# Patient Record
Sex: Female | Born: 1965 | Race: White | Hispanic: No | Marital: Married | Smoking: Former smoker
Health system: Southern US, Community
[De-identification: ages and names within clinical notes are randomized; demographics above are authoritative.]

## PROBLEM LIST (undated history)

## (undated) DIAGNOSIS — G8929 Other chronic pain: Secondary | ICD-10-CM

## (undated) DIAGNOSIS — M159 Polyosteoarthritis, unspecified: Secondary | ICD-10-CM

## (undated) DIAGNOSIS — J45909 Unspecified asthma, uncomplicated: Secondary | ICD-10-CM

## (undated) DIAGNOSIS — R7611 Nonspecific reaction to tuberculin skin test without active tuberculosis: Secondary | ICD-10-CM

## (undated) DIAGNOSIS — M1990 Unspecified osteoarthritis, unspecified site: Secondary | ICD-10-CM

## (undated) DIAGNOSIS — F411 Generalized anxiety disorder: Secondary | ICD-10-CM

## (undated) DIAGNOSIS — R5383 Other fatigue: Secondary | ICD-10-CM

## (undated) DIAGNOSIS — G4700 Insomnia, unspecified: Secondary | ICD-10-CM

## (undated) DIAGNOSIS — R0609 Other forms of dyspnea: Secondary | ICD-10-CM

## (undated) DIAGNOSIS — N3941 Urge incontinence: Secondary | ICD-10-CM

## (undated) DIAGNOSIS — R42 Dizziness and giddiness: Secondary | ICD-10-CM

## (undated) DIAGNOSIS — R0600 Dyspnea, unspecified: Secondary | ICD-10-CM

## (undated) DIAGNOSIS — F17201 Nicotine dependence, unspecified, in remission: Secondary | ICD-10-CM

## (undated) DIAGNOSIS — K589 Irritable bowel syndrome without diarrhea: Secondary | ICD-10-CM

## (undated) DIAGNOSIS — G43909 Migraine, unspecified, not intractable, without status migrainosus: Secondary | ICD-10-CM

## (undated) DIAGNOSIS — F419 Anxiety disorder, unspecified: Secondary | ICD-10-CM

## (undated) DIAGNOSIS — R609 Edema, unspecified: Secondary | ICD-10-CM

## (undated) DIAGNOSIS — M545 Low back pain, unspecified: Secondary | ICD-10-CM

## (undated) DIAGNOSIS — G44209 Tension-type headache, unspecified, not intractable: Secondary | ICD-10-CM

## (undated) DIAGNOSIS — E782 Mixed hyperlipidemia: Secondary | ICD-10-CM

## (undated) DIAGNOSIS — M549 Dorsalgia, unspecified: Secondary | ICD-10-CM

## (undated) DIAGNOSIS — J329 Chronic sinusitis, unspecified: Secondary | ICD-10-CM

## (undated) DIAGNOSIS — F329 Major depressive disorder, single episode, unspecified: Secondary | ICD-10-CM

## (undated) DIAGNOSIS — M542 Cervicalgia: Secondary | ICD-10-CM

## (undated) DIAGNOSIS — M797 Fibromyalgia: Secondary | ICD-10-CM

## (undated) DIAGNOSIS — M722 Plantar fascial fibromatosis: Secondary | ICD-10-CM

## (undated) DIAGNOSIS — E663 Overweight: Secondary | ICD-10-CM

## (undated) DIAGNOSIS — Z86010 Personal history of colonic polyps: Secondary | ICD-10-CM

## (undated) DIAGNOSIS — F909 Attention-deficit hyperactivity disorder, unspecified type: Secondary | ICD-10-CM

## (undated) DIAGNOSIS — M25511 Pain in right shoulder: Secondary | ICD-10-CM

## (undated) DIAGNOSIS — C44310 Basal cell carcinoma of skin of unspecified parts of face: Secondary | ICD-10-CM

## (undated) HISTORY — DX: Dyspnea, unspecified: R06.00

## (undated) HISTORY — DX: Edema, unspecified: R60.9

## (undated) HISTORY — DX: Personal history of colonic polyps: Z86.010

## (undated) HISTORY — DX: Nonspecific reaction to tuberculin skin test without active tuberculosis: R76.11

## (undated) HISTORY — DX: Major depressive disorder, single episode, unspecified: F32.9

## (undated) HISTORY — DX: Dorsalgia, unspecified: M54.9

## (undated) HISTORY — DX: Plantar fascial fibromatosis: M72.2

## (undated) HISTORY — DX: Overweight: E66.3

## (undated) HISTORY — DX: Generalized anxiety disorder: F41.1

## (undated) HISTORY — DX: Migraine, unspecified, not intractable, without status migrainosus: G43.909

## (undated) HISTORY — DX: Insomnia, unspecified: G47.00

## (undated) HISTORY — DX: Dizziness and giddiness: R42

## (undated) HISTORY — DX: Low back pain, unspecified: M54.50

## (undated) HISTORY — DX: Basal cell carcinoma of skin of unspecified parts of face: C44.310

## (undated) HISTORY — DX: Fibromyalgia: M79.7

## (undated) HISTORY — DX: Chronic sinusitis, unspecified: J32.9

## (undated) HISTORY — DX: Other chronic pain: G89.29

## (undated) HISTORY — DX: Unspecified asthma, uncomplicated: J45.909

## (undated) HISTORY — DX: Unspecified osteoarthritis, unspecified site: M19.90

## (undated) HISTORY — DX: Other fatigue: R53.83

## (undated) HISTORY — DX: Attention-deficit hyperactivity disorder, unspecified type: F90.9

## (undated) HISTORY — DX: Other forms of dyspnea: R06.09

## (undated) HISTORY — DX: Urge incontinence: N39.41

## (undated) HISTORY — DX: Irritable bowel syndrome without diarrhea: K58.9

## (undated) HISTORY — PX: NECK SURGERY: SHX720

## (undated) HISTORY — DX: Nicotine dependence, unspecified, in remission: F17.201

## (undated) HISTORY — DX: Mixed hyperlipidemia: E78.2

## (undated) HISTORY — DX: Cervicalgia: M54.2

## (undated) HISTORY — DX: Tension-type headache, unspecified, not intractable: G44.209

## (undated) HISTORY — PX: DILATION AND CURETTAGE OF UTERUS: SHX78

## (undated) HISTORY — DX: Polyosteoarthritis, unspecified: M15.9

## (undated) HISTORY — PX: OTHER SURGICAL HISTORY: SHX169

## (undated) HISTORY — PX: NOSE SURGERY: SHX723

## (undated) HISTORY — PX: COLONOSCOPY: SHX174

## (undated) HISTORY — DX: Pain in right shoulder: M25.511

---
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
## 1985-06-14 HISTORY — PX: OTHER SURGICAL HISTORY: SHX169

## 1997-07-27 ENCOUNTER — Observation Stay (HOSPITAL_COMMUNITY): Admission: AD | Admit: 1997-07-27 | Discharge: 1997-07-28 | Payer: Self-pay | Admitting: Obstetrics and Gynecology

## 1997-07-31 ENCOUNTER — Inpatient Hospital Stay (HOSPITAL_COMMUNITY): Admission: AD | Admit: 1997-07-31 | Discharge: 1997-08-02 | Payer: Self-pay | Admitting: Obstetrics and Gynecology

## 1999-04-30 ENCOUNTER — Ambulatory Visit (HOSPITAL_COMMUNITY): Admission: RE | Admit: 1999-04-30 | Discharge: 1999-04-30 | Payer: Self-pay | Admitting: Family Medicine

## 1999-04-30 ENCOUNTER — Encounter: Payer: Self-pay | Admitting: Family Medicine

## 1999-09-17 ENCOUNTER — Other Ambulatory Visit: Admission: RE | Admit: 1999-09-17 | Discharge: 1999-09-17 | Payer: Self-pay | Admitting: Obstetrics and Gynecology

## 1999-10-03 ENCOUNTER — Ambulatory Visit (HOSPITAL_COMMUNITY): Admission: RE | Admit: 1999-10-03 | Discharge: 1999-10-03 | Payer: Self-pay | Admitting: Obstetrics and Gynecology

## 2000-06-14 HISTORY — PX: OTHER SURGICAL HISTORY: SHX169

## 2000-09-30 ENCOUNTER — Encounter: Payer: Self-pay | Admitting: Emergency Medicine

## 2000-09-30 ENCOUNTER — Emergency Department (HOSPITAL_COMMUNITY): Admission: AC | Admit: 2000-09-30 | Discharge: 2000-09-30 | Payer: Self-pay

## 2000-10-12 ENCOUNTER — Encounter: Admission: RE | Admit: 2000-10-12 | Discharge: 2000-10-12 | Payer: Self-pay | Admitting: *Deleted

## 2000-11-11 ENCOUNTER — Other Ambulatory Visit: Admission: RE | Admit: 2000-11-11 | Discharge: 2000-11-11 | Payer: Self-pay | Admitting: Obstetrics and Gynecology

## 2000-12-24 ENCOUNTER — Encounter: Admission: RE | Admit: 2000-12-24 | Discharge: 2000-12-24 | Payer: Self-pay | Admitting: *Deleted

## 2001-01-19 ENCOUNTER — Encounter: Admission: RE | Admit: 2001-01-19 | Discharge: 2001-03-30 | Payer: Self-pay | Admitting: Family Medicine

## 2001-04-20 ENCOUNTER — Ambulatory Visit (HOSPITAL_BASED_OUTPATIENT_CLINIC_OR_DEPARTMENT_OTHER): Admission: RE | Admit: 2001-04-20 | Discharge: 2001-04-20 | Payer: Self-pay | Admitting: Orthopedic Surgery

## 2001-04-28 ENCOUNTER — Encounter: Admission: RE | Admit: 2001-04-28 | Discharge: 2001-07-27 | Payer: Self-pay | Admitting: Orthopedic Surgery

## 2001-07-28 ENCOUNTER — Encounter: Admission: RE | Admit: 2001-07-28 | Discharge: 2001-10-26 | Payer: Self-pay | Admitting: Orthopedic Surgery

## 2001-08-30 ENCOUNTER — Encounter: Payer: Self-pay | Admitting: Neurosurgery

## 2001-08-30 ENCOUNTER — Inpatient Hospital Stay (HOSPITAL_COMMUNITY): Admission: RE | Admit: 2001-08-30 | Discharge: 2001-08-31 | Payer: Self-pay | Admitting: Neurosurgery

## 2001-11-15 ENCOUNTER — Other Ambulatory Visit: Admission: RE | Admit: 2001-11-15 | Discharge: 2001-11-15 | Payer: Self-pay | Admitting: Obstetrics and Gynecology

## 2001-11-16 ENCOUNTER — Encounter (INDEPENDENT_AMBULATORY_CARE_PROVIDER_SITE_OTHER): Payer: Self-pay | Admitting: *Deleted

## 2001-11-16 ENCOUNTER — Observation Stay (HOSPITAL_COMMUNITY): Admission: RE | Admit: 2001-11-16 | Discharge: 2001-11-17 | Payer: Self-pay | Admitting: Obstetrics and Gynecology

## 2002-01-12 ENCOUNTER — Encounter: Payer: Self-pay | Admitting: Orthopedic Surgery

## 2002-01-12 ENCOUNTER — Encounter: Admission: RE | Admit: 2002-01-12 | Discharge: 2002-01-12 | Payer: Self-pay | Admitting: Orthopedic Surgery

## 2002-06-14 HISTORY — PX: OTHER SURGICAL HISTORY: SHX169

## 2002-06-14 HISTORY — PX: ABDOMINAL HYSTERECTOMY: SHX81

## 2002-07-06 ENCOUNTER — Encounter (INDEPENDENT_AMBULATORY_CARE_PROVIDER_SITE_OTHER): Payer: Self-pay | Admitting: Specialist

## 2002-07-06 ENCOUNTER — Ambulatory Visit (HOSPITAL_BASED_OUTPATIENT_CLINIC_OR_DEPARTMENT_OTHER): Admission: RE | Admit: 2002-07-06 | Discharge: 2002-07-06 | Payer: Self-pay | Admitting: Otolaryngology

## 2002-11-03 ENCOUNTER — Emergency Department (HOSPITAL_COMMUNITY): Admission: EM | Admit: 2002-11-03 | Discharge: 2002-11-03 | Payer: Self-pay | Admitting: Emergency Medicine

## 2002-12-21 ENCOUNTER — Other Ambulatory Visit: Admission: RE | Admit: 2002-12-21 | Discharge: 2002-12-21 | Payer: Self-pay | Admitting: Obstetrics and Gynecology

## 2003-11-07 ENCOUNTER — Inpatient Hospital Stay (HOSPITAL_COMMUNITY): Admission: RE | Admit: 2003-11-07 | Discharge: 2003-11-08 | Payer: Self-pay | Admitting: Neurosurgery

## 2004-07-19 ENCOUNTER — Emergency Department (HOSPITAL_COMMUNITY): Admission: EM | Admit: 2004-07-19 | Discharge: 2004-07-19 | Payer: Self-pay | Admitting: Emergency Medicine

## 2004-11-14 ENCOUNTER — Emergency Department (HOSPITAL_COMMUNITY): Admission: EM | Admit: 2004-11-14 | Discharge: 2004-11-15 | Payer: Self-pay | Admitting: Emergency Medicine

## 2005-05-20 ENCOUNTER — Ambulatory Visit (HOSPITAL_BASED_OUTPATIENT_CLINIC_OR_DEPARTMENT_OTHER): Admission: RE | Admit: 2005-05-20 | Discharge: 2005-05-20 | Payer: Self-pay | Admitting: Family Medicine

## 2005-05-23 ENCOUNTER — Ambulatory Visit: Payer: Self-pay | Admitting: Internal Medicine

## 2006-12-12 ENCOUNTER — Ambulatory Visit (HOSPITAL_COMMUNITY): Admission: RE | Admit: 2006-12-12 | Discharge: 2006-12-12 | Payer: Self-pay | Admitting: Otolaryngology

## 2007-06-15 ENCOUNTER — Emergency Department (HOSPITAL_COMMUNITY): Admission: EM | Admit: 2007-06-15 | Discharge: 2007-06-15 | Payer: Self-pay | Admitting: Emergency Medicine

## 2007-06-15 HISTORY — PX: OTHER SURGICAL HISTORY: SHX169

## 2008-05-28 ENCOUNTER — Ambulatory Visit (HOSPITAL_BASED_OUTPATIENT_CLINIC_OR_DEPARTMENT_OTHER): Admission: RE | Admit: 2008-05-28 | Discharge: 2008-05-28 | Payer: Self-pay | Admitting: Orthopaedic Surgery

## 2008-06-14 HISTORY — PX: OTHER SURGICAL HISTORY: SHX169

## 2009-12-03 ENCOUNTER — Ambulatory Visit: Payer: Self-pay | Admitting: Diagnostic Radiology

## 2009-12-03 ENCOUNTER — Emergency Department (HOSPITAL_BASED_OUTPATIENT_CLINIC_OR_DEPARTMENT_OTHER): Admission: EM | Admit: 2009-12-03 | Discharge: 2009-12-04 | Payer: Self-pay | Admitting: Emergency Medicine

## 2009-12-04 ENCOUNTER — Ambulatory Visit: Payer: Self-pay | Admitting: Radiology

## 2010-04-22 ENCOUNTER — Emergency Department (HOSPITAL_BASED_OUTPATIENT_CLINIC_OR_DEPARTMENT_OTHER): Admission: EM | Admit: 2010-04-22 | Discharge: 2010-04-22 | Payer: Self-pay | Admitting: Emergency Medicine

## 2010-08-25 ENCOUNTER — Other Ambulatory Visit: Payer: Self-pay | Admitting: Orthopedic Surgery

## 2010-08-25 DIAGNOSIS — M25511 Pain in right shoulder: Secondary | ICD-10-CM

## 2010-08-30 ENCOUNTER — Ambulatory Visit
Admission: RE | Admit: 2010-08-30 | Discharge: 2010-08-30 | Disposition: A | Discharge status: Home / Self Care | Payer: BC Managed Care – PPO | Source: Ambulatory Visit | Attending: Orthopedic Surgery | Admitting: Orthopedic Surgery

## 2010-08-30 DIAGNOSIS — M25511 Pain in right shoulder: Secondary | ICD-10-CM

## 2010-08-30 LAB — POCT B-TYPE NATRIURETIC PEPTIDE (BNP): B Natriuretic Peptide, POC: <5 pg/mL (ref 0–100)

## 2010-08-30 LAB — D-DIMER, QUANTITATIVE: D-Dimer, Quant: <0.22 ug/mL-FEU (ref 0.00–0.48)

## 2010-10-20 ENCOUNTER — Encounter: Payer: Self-pay | Admitting: Family Medicine

## 2010-10-20 ENCOUNTER — Ambulatory Visit (INDEPENDENT_AMBULATORY_CARE_PROVIDER_SITE_OTHER): Payer: BC Managed Care – PPO | Admitting: Family Medicine

## 2010-10-20 DIAGNOSIS — C44310 Basal cell carcinoma of skin of unspecified parts of face: Secondary | ICD-10-CM

## 2010-10-20 DIAGNOSIS — G44209 Tension-type headache, unspecified, not intractable: Secondary | ICD-10-CM

## 2010-10-20 DIAGNOSIS — N3941 Urge incontinence: Secondary | ICD-10-CM

## 2010-10-20 DIAGNOSIS — N809 Endometriosis, unspecified: Secondary | ICD-10-CM

## 2010-10-20 DIAGNOSIS — M25519 Pain in unspecified shoulder: Secondary | ICD-10-CM

## 2010-10-20 DIAGNOSIS — R5381 Other malaise: Secondary | ICD-10-CM

## 2010-10-20 DIAGNOSIS — F339 Major depressive disorder, recurrent, unspecified: Secondary | ICD-10-CM

## 2010-10-20 DIAGNOSIS — R7611 Nonspecific reaction to tuberculin skin test without active tuberculosis: Secondary | ICD-10-CM

## 2010-10-20 DIAGNOSIS — R1013 Epigastric pain: Secondary | ICD-10-CM

## 2010-10-20 DIAGNOSIS — J329 Chronic sinusitis, unspecified: Secondary | ICD-10-CM

## 2010-10-20 DIAGNOSIS — J45990 Exercise induced bronchospasm: Secondary | ICD-10-CM | POA: Insufficient documentation

## 2010-10-20 DIAGNOSIS — K589 Irritable bowel syndrome without diarrhea: Secondary | ICD-10-CM

## 2010-10-20 DIAGNOSIS — R609 Edema, unspecified: Secondary | ICD-10-CM

## 2010-10-20 DIAGNOSIS — Z720 Tobacco use: Secondary | ICD-10-CM

## 2010-10-20 DIAGNOSIS — C44319 Basal cell carcinoma of skin of other parts of face: Secondary | ICD-10-CM

## 2010-10-20 DIAGNOSIS — G8929 Other chronic pain: Secondary | ICD-10-CM

## 2010-10-20 DIAGNOSIS — F17200 Nicotine dependence, unspecified, uncomplicated: Secondary | ICD-10-CM

## 2010-10-20 DIAGNOSIS — R5383 Other fatigue: Secondary | ICD-10-CM

## 2010-10-20 DIAGNOSIS — M797 Fibromyalgia: Secondary | ICD-10-CM

## 2010-10-20 DIAGNOSIS — J309 Allergic rhinitis, unspecified: Secondary | ICD-10-CM

## 2010-10-20 DIAGNOSIS — IMO0001 Reserved for inherently not codable concepts without codable children: Secondary | ICD-10-CM

## 2010-10-20 DIAGNOSIS — M542 Cervicalgia: Secondary | ICD-10-CM

## 2010-10-20 DIAGNOSIS — G43909 Migraine, unspecified, not intractable, without status migrainosus: Secondary | ICD-10-CM

## 2010-10-20 DIAGNOSIS — F329 Major depressive disorder, single episode, unspecified: Secondary | ICD-10-CM

## 2010-10-20 DIAGNOSIS — M549 Dorsalgia, unspecified: Secondary | ICD-10-CM

## 2010-10-20 DIAGNOSIS — M722 Plantar fascial fibromatosis: Secondary | ICD-10-CM

## 2010-10-20 DIAGNOSIS — Z0000 Encounter for general adult medical examination without abnormal findings: Secondary | ICD-10-CM

## 2010-10-20 DIAGNOSIS — M25511 Pain in right shoulder: Secondary | ICD-10-CM | POA: Insufficient documentation

## 2010-10-20 DIAGNOSIS — E663 Overweight: Secondary | ICD-10-CM

## 2010-10-20 DIAGNOSIS — E785 Hyperlipidemia, unspecified: Secondary | ICD-10-CM

## 2010-10-20 DIAGNOSIS — F32A Depression, unspecified: Secondary | ICD-10-CM | POA: Insufficient documentation

## 2010-10-20 DIAGNOSIS — J45909 Unspecified asthma, uncomplicated: Secondary | ICD-10-CM

## 2010-10-20 DIAGNOSIS — K3189 Other diseases of stomach and duodenum: Secondary | ICD-10-CM

## 2010-10-20 HISTORY — DX: Other chronic pain: G89.29

## 2010-10-20 HISTORY — DX: Unspecified asthma, uncomplicated: J45.909

## 2010-10-20 HISTORY — DX: Major depressive disorder, recurrent, unspecified: F33.9

## 2010-10-20 MED ORDER — RANITIDINE HCL 300 MG PO TABS: 300.0000 mg | ORAL_TABLET | Freq: Every day | ORAL | Status: DC

## 2010-10-20 MED ORDER — MILNACIPRAN HCL 12.5 & 25 & 50 MG PO MISC: ORAL | Status: DC

## 2010-10-20 MED ORDER — MILNACIPRAN HCL 50 MG PO TABS: 50.0000 mg | ORAL_TABLET | Freq: Two times a day (BID) | ORAL | Status: DC

## 2010-10-20 NOTE — Patient Instructions (Signed)
fibromyalgiaFibromyalgia Fibromyalgia is a disorder that is often misunderstood. It is associated with muscular pains and tenderness that comes and goes. It is often associated with fatigue and sleep disturbances. Though it tends to be long-lasting, fibromyalgia is not life-threatening. CAUSES The exact cause of fibromyalgia is unknown. People with certain gene types are predisposed to developing fibromyalgia and other conditions. Certain factors can play a role as triggers, such as:  Spine disorders.   Arthritis.   Severe injury (trauma) and other physical stressors.   Emotional stressors.  SYMPTOMS  The main symptom is pain and stiffness in the muscles and joints, which can vary over time.   Sleep and fatigue problems.  Other related symptoms may include:  Bowel and bladder problems.   Headaches.   Visual problems.   Problems with odors and noises.   Depression or mood changes.   Painful periods (dysmenorrhea).   Dryness of the skin or eyes.  DIAGNOSIS There are no specific tests for diagnosing fibromyalgia. Patients can be diagnosed accurately from the specific symptoms they have. The diagnosis is made by determining that nothing else is causing the problems. TREATMENT There is no cure. Management includes medicines and an active, healthy lifestyle. The goal is to enhance physical fitness, decrease pain, and improve sleep. HOME CARE INSTRUCTIONS  Only take over-the-counter or prescription medicines as directed by your caregiver. Sleeping pills, tranquilizers, and pain medicines may make your problems worse.   Low-impact aerobic exercise is very important and advised for treatment. At first, it may seem to make pain worse. Gradually increasing your tolerance will overcome this feeling.   Learning relaxation techniques and how to control stress will help you. Biofeedback, visual imagery, hypnosis, muscle relaxation, yoga, and meditation are all options.    Anti-inflammatory medicines and physical therapy may provide short-term help.   Acupuncture or massage treatments may help.   Take muscle relaxant medicines as suggested by your caregiver.   Avoid stressful situations.   Plan a healthy lifestyle. This includes your diet, sleep, rest, exercise, and friends.   Find and practice a hobby you enjoy.   Join a fibromyalgia support group for interaction, ideas, and sharing advice. This may be helpful.  SEEK MEDICAL CARE IF: You are not having good results or improvement from your treatment. FOR MORE INFORMATION National Fibromyalgia Association: www.fmaware.org Arthritis Foundation: www.arthritis.org Document Released: 05/31/2005 Document Re-Released: 11/18/2009 [facility] Patient Information 2011 [place], [place].  Start Benefiber powder 2 tsp by mouth in liquids twice a day, 64 oz of clear fluids daily  Add a probiotic such as Align daily  Will call in Flexeril/Cyclobenzaprine to use at bed for pain Call with any concerns

## 2010-10-21 ENCOUNTER — Other Ambulatory Visit (INDEPENDENT_AMBULATORY_CARE_PROVIDER_SITE_OTHER): Payer: BC Managed Care – PPO | Admitting: Family Medicine

## 2010-10-21 ENCOUNTER — Other Ambulatory Visit (INDEPENDENT_AMBULATORY_CARE_PROVIDER_SITE_OTHER): Payer: BC Managed Care – PPO

## 2010-10-21 ENCOUNTER — Encounter: Payer: Self-pay | Admitting: Family Medicine

## 2010-10-21 DIAGNOSIS — N3941 Urge incontinence: Secondary | ICD-10-CM

## 2010-10-21 DIAGNOSIS — G43909 Migraine, unspecified, not intractable, without status migrainosus: Secondary | ICD-10-CM | POA: Insufficient documentation

## 2010-10-21 DIAGNOSIS — G4489 Other headache syndrome: Secondary | ICD-10-CM

## 2010-10-21 DIAGNOSIS — K3189 Other diseases of stomach and duodenum: Secondary | ICD-10-CM

## 2010-10-21 DIAGNOSIS — E663 Overweight: Secondary | ICD-10-CM

## 2010-10-21 DIAGNOSIS — R600 Localized edema: Secondary | ICD-10-CM

## 2010-10-21 DIAGNOSIS — Z0000 Encounter for general adult medical examination without abnormal findings: Secondary | ICD-10-CM

## 2010-10-21 DIAGNOSIS — J329 Chronic sinusitis, unspecified: Secondary | ICD-10-CM

## 2010-10-21 DIAGNOSIS — G44209 Tension-type headache, unspecified, not intractable: Secondary | ICD-10-CM | POA: Insufficient documentation

## 2010-10-21 DIAGNOSIS — Z720 Tobacco use: Secondary | ICD-10-CM | POA: Insufficient documentation

## 2010-10-21 DIAGNOSIS — Z8742 Personal history of other diseases of the female genital tract: Secondary | ICD-10-CM

## 2010-10-21 DIAGNOSIS — C44310 Basal cell carcinoma of skin of unspecified parts of face: Secondary | ICD-10-CM

## 2010-10-21 DIAGNOSIS — M797 Fibromyalgia: Secondary | ICD-10-CM | POA: Insufficient documentation

## 2010-10-21 DIAGNOSIS — M722 Plantar fascial fibromatosis: Secondary | ICD-10-CM | POA: Insufficient documentation

## 2010-10-21 DIAGNOSIS — Z13220 Encounter for screening for lipoid disorders: Secondary | ICD-10-CM

## 2010-10-21 DIAGNOSIS — R7611 Nonspecific reaction to tuberculin skin test without active tuberculosis: Secondary | ICD-10-CM

## 2010-10-21 DIAGNOSIS — N809 Endometriosis, unspecified: Secondary | ICD-10-CM | POA: Insufficient documentation

## 2010-10-21 DIAGNOSIS — K589 Irritable bowel syndrome without diarrhea: Secondary | ICD-10-CM

## 2010-10-21 DIAGNOSIS — R609 Edema, unspecified: Secondary | ICD-10-CM

## 2010-10-21 DIAGNOSIS — M549 Dorsalgia, unspecified: Secondary | ICD-10-CM | POA: Insufficient documentation

## 2010-10-21 DIAGNOSIS — J45990 Exercise induced bronchospasm: Secondary | ICD-10-CM

## 2010-10-21 DIAGNOSIS — R5383 Other fatigue: Secondary | ICD-10-CM | POA: Insufficient documentation

## 2010-10-21 DIAGNOSIS — E785 Hyperlipidemia, unspecified: Secondary | ICD-10-CM | POA: Insufficient documentation

## 2010-10-21 DIAGNOSIS — R5381 Other malaise: Secondary | ICD-10-CM

## 2010-10-21 DIAGNOSIS — J309 Allergic rhinitis, unspecified: Secondary | ICD-10-CM | POA: Insufficient documentation

## 2010-10-21 DIAGNOSIS — R5382 Chronic fatigue, unspecified: Secondary | ICD-10-CM

## 2010-10-21 DIAGNOSIS — R1013 Epigastric pain: Secondary | ICD-10-CM

## 2010-10-21 HISTORY — DX: Urge incontinence: N39.41

## 2010-10-21 HISTORY — DX: Personal history of other diseases of the female genital tract: Z87.42

## 2010-10-21 HISTORY — DX: Other headache syndrome: G44.89

## 2010-10-21 HISTORY — DX: Nonspecific reaction to tuberculin skin test without active tuberculosis: R76.11

## 2010-10-21 HISTORY — DX: Basal cell carcinoma of skin of unspecified parts of face: C44.310

## 2010-10-21 HISTORY — DX: Edema, unspecified: R60.9

## 2010-10-21 HISTORY — DX: Chronic fatigue, unspecified: R53.82

## 2010-10-21 HISTORY — DX: Overweight: E66.3

## 2010-10-21 HISTORY — DX: Chronic sinusitis, unspecified: J32.9

## 2010-10-21 HISTORY — DX: Irritable bowel syndrome, unspecified: K58.9

## 2010-10-21 HISTORY — DX: Localized edema: R60.0

## 2010-10-21 LAB — CBC WITH DIFFERENTIAL/PLATELET
Basophils Relative: 0.4 % (ref 0.0–3.0)
Eosinophils Relative: 2.1 % (ref 0.0–5.0)
MCHC: 34.5 g/dL (ref 30.0–36.0)
Monocytes Relative: 7 % (ref 3.0–12.0)
Neutrophils Relative %: 57.6 % (ref 43.0–77.0)
Platelets: 286 10*3/uL (ref 150.0–400.0)
RBC: 4.63 Mil/uL (ref 3.87–5.11)
RDW: 13.6 % (ref 11.5–14.6)

## 2010-10-21 LAB — RENAL FUNCTION PANEL
Albumin: 3.8 g/dL (ref 3.5–5.2)
GFR: 94.43 mL/min (ref >60.00)
Potassium: 4.2 mEq/L (ref 3.5–5.1)

## 2010-10-21 LAB — LDL CHOLESTEROL, DIRECT: Direct LDL: 170 mg/dL

## 2010-10-21 LAB — HEPATIC FUNCTION PANEL
ALT: 15 U/L (ref 0–35)
AST: 17 U/L (ref 0–37)
Albumin: 3.8 g/dL (ref 3.5–5.2)
Bilirubin, Direct: 0 mg/dL (ref 0.0–0.3)
Total Bilirubin: 0.5 mg/dL (ref 0.3–1.2)
Total Protein: 6.7 g/dL (ref 6.0–8.3)

## 2010-10-21 LAB — H. PYLORI ANTIBODY, IGG: H Pylori IgG: NEGATIVE

## 2010-10-21 LAB — TSH: TSH: 1.95 u[IU]/mL (ref 0.35–5.50)

## 2010-10-21 LAB — SEDIMENTATION RATE: Sed Rate: 25 mm/hr — ABNORMAL HIGH (ref 0–22)

## 2010-10-21 LAB — LIPID PANEL: HDL: 37.9 mg/dL — ABNORMAL LOW (ref >39.00)

## 2010-10-21 MED ORDER — FLUTICASONE PROPIONATE 50 MCG/ACT NA SUSP: 2.0000 | Freq: Every day | NASAL | Status: DC

## 2010-10-21 MED ORDER — SPIRONOLACTONE 25 MG PO TABS: ORAL_TABLET | ORAL | Status: DC

## 2010-10-21 MED ORDER — ALBUTEROL SULFATE HFA 108 (90 BASE) MCG/ACT IN AERS: 2.0000 | INHALATION_SPRAY | Freq: Four times a day (QID) | RESPIRATORY_TRACT | Status: DC | PRN

## 2010-10-21 NOTE — Assessment & Plan Note (Signed)
Believes this is stable and tolerable at this time, does not want medications to address at present. Took Cymbalta in past without benfit to depression or fibromyalgia

## 2010-10-21 NOTE — Assessment & Plan Note (Signed)
Has surgery scheduled in June of 2012 with Dr Eulah Pont for surgical correction of arthritic spurs and an excessively long shoulder blade to be trimmed. This is also causing significant inflammation of her rotator cuff and pain. She has previously had a similar surgery to the left shoulder and that was very helpful

## 2010-10-21 NOTE — Assessment & Plan Note (Signed)
Patient advised that length regarding the need to quit smoking. Reminded that should her smoking contributes to heart disease, lung disease, strokes, dementia and many many kinds of cancer. Patient noncommittal but says she is using Chantix with good effect in the past and is willing to start that again. Presently we are starting several the medication so we will hold off on the Chantix to the next visit and she is encouraged to try cutting back in the meantime.

## 2010-10-21 NOTE — Assessment & Plan Note (Signed)
Patient has actually struggled with neck and back pain for many years, no recent flares and she is managing well, try Flexeril and maintain as much activity as tolerated
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
## 2010-10-21 NOTE — Assessment & Plan Note (Signed)
Patient describes a long and complicated history of trouble with her sinuses dating back to childhood. When she was very young she suffered a nasal fracture, then in her teens they performed 2 surgeries to correct the deformities . Unfortunately as an adult she has suffered with severe allergies despite allergy shots and medications, as well as nasal polyps. She has undergone two further sinus surgeries with [facility] and yet she continue to get sinus infections. Lately she has had increased congestion, malaise and difficulty breathing. She is out of her Fluticasone nasal spray so we have refilled that and she is placed on a course of Azithromycin and we will reevaluate at next visit.

## 2010-10-21 NOTE — Assessment & Plan Note (Signed)
She denies any hemoptysis or concerning symptoms but she does not believe she has had a CXR for 13 years (maybe she has had 1 but she is unsure). We will likely obtain a baseline  CXR at her next visit.

## 2010-10-21 NOTE — Assessment & Plan Note (Signed)
Refill given on Fluticasone nasal sprays 2 sprays each nostil daily and try saline nasal sprays and nonsedating antihistamines prn

## 2010-10-21 NOTE — Assessment & Plan Note (Signed)
Mild but notable over the past several months, encouraged low sodium diet and elevation of feet prn but will allow Spironolactone 25 mg daily prn edema for now and reassess at next visit and after lab work

## 2010-10-21 NOTE — Assessment & Plan Note (Signed)
Patient has had trouble with both feet in the past. At one point it was bad enough to require surgery to the left foot and some extensive therapy and  therapy to the right foot. Presently she only has trouble intermittently. Needs daily stretching and ice prn, may require referral to podiatry in the future.

## 2010-10-21 NOTE — Assessment & Plan Note (Signed)
Patient is unsure of the levels of her hyperlipidemia and has not had laparotomy quite some time so we will check an FLP for addressing this situation further she is encouraged to increase activity and avoid trans-fat

## 2010-10-21 NOTE — Assessment & Plan Note (Signed)
Has a long history of intermittent migraines but has not had them as frequently. Has had trouble with nausea, photophobia and phonophobia in the past, encouraged good life style choices and will think further about medications at the next visit if symptoms worsen

## 2010-10-21 NOTE — Assessment & Plan Note (Signed)
Likely multifactorial and related to fibromyalgia, chronic muscle pain, stress, etc, will start by treating Fibromyalgia and she is given Flexeril to use qhs to see if that helps with muscle spasms and sleep. Discussed need for good sleep hygiene

## 2010-10-21 NOTE — Assessment & Plan Note (Signed)
Patient reports a long history of difficulty with urge incontinence which is slowly getting worse. She came control until she feels the urge to go and she doesn't listen immediately she can have trouble with incontinence. She denies dysuria or fevers back pain or recent change in her symptoms. She'll be instructed regarding kegel exercises and we will consider possible referral her medications in the future if no improvement

## 2010-10-21 NOTE — Assessment & Plan Note (Signed)
Was worse as a child but she can still flare with significant exertion will rx Albuterol to use as needed
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
## 2010-10-21 NOTE — Assessment & Plan Note (Signed)
Patient reports long history of trouble with IBS. She'll alternate between constipation and diarrhea but definitely constipation is dominant. She is encouraged to take in 64 ounces of fluids daily. She should recover over 2 teaspoons twice daily. She should also take probiotics daily increase her activity level and the roughage in her diet and we will reassess at next visit.

## 2010-10-21 NOTE — Assessment & Plan Note (Signed)
Patient has a long history of frequent painful headaches both tension and migraine types. At this point in time her past drug use headaches are tension in nature. They often start in her neck and generalized. No photophobia or phonophobia noted. She may try ice and heat intermittently over-the-counter ibuprofen can be alternated with Tylenol and we may consider further treatments in the future. She also needs to achieve sleep. She describes a clear fluid eats small frequent meals.

## 2010-10-21 NOTE — Assessment & Plan Note (Signed)
Will check labs including a TSH prior to next visit. She is encouraged to get 7-8 hours of sleep nightly, avoid trans fats, eat small and frequent meals every 4-5 hours but decrease overall po intake. Increase activity level

## 2010-10-21 NOTE — Assessment & Plan Note (Signed)
Patient reports she has had the diagnosis for years. She struggles with chronic fatigue and pain. The pain is diffuse. She denies any medications are excessively helpful for the pain but is not presently taking any maintenance medications for this malady. She agrees to try a trial of Savella. She is given a titration pack and instructed to use it as a package instructs starting with 25 mg tablets and working her way up to 0.5 twice a day. Should she tolerate this medication she is sent in a prescription for the stability of a 0.5 mg tablets to take 1 twice a day. We will see her back in 3 weeks to evaluate her response. And she may call for further instructions if she feels she is having any difficulty with the medication.

## 2010-10-27 NOTE — Op Note (Signed)
Hannah Garrett, ALBERT              ACCOUNT NO.:  [id number]   MEDICAL RECORD NO.:  [id number]          PATIENT TYPE:  AMB   LOCATION:  DSC                          FACILITY:  [facility]   PHYSICIAN:  Lubertha Basque. Dalldorf, M.D.DATE OF BIRTH:  03/18/1966   DATE OF PROCEDURE:  05/28/2008  DATE OF DISCHARGE:                               OPERATIVE REPORT   PREOPERATIVE DIAGNOSIS:  Left foot plantar fasciitis.   POSTOPERATIVE DIAGNOSIS:  Left foot plantar fasciitis.   PROCEDURE:  Left foot endoscopic plantar fascia release.   ANESTHESIA:  General.   ATTENDING SURGEON:  Lubertha Basque. Jerl Santos, MD   ASSISTANT:  Lindwood Qua, PA   INDICATIONS FOR PROCEDURE:  The patient is a 45 year old woman with a  long history of left greater than right heel pain.  This has persisted  despite oral anti-inflammatories, shoe inserts, multiple injections,  supervised therapy, and other treatments.  She persists with pain which  limits her ability to exercise and walk and she is offered an endoscopic  plantar fascia release at this point.  Informed operative consent was  obtained after discussion of possible complications of reaction to  anesthesia, infection, and neurovascular injury.  The success rate of  this operation at only 60%, was also stressed with the patient.   SUMMARY, FINDINGS, AND PROCEDURE:  Under general anesthesia through 2  small portals, an endoscopic plantar fascia release was performed  utilizing the Topaz device.   DESCRIPTION OF PROCEDURE:  The patient was brought to the operating  suite where general anesthetic was applied without difficulty.  She was  positioned supine and prepped and draped in normal sterile fashion.  After administration of IV Kefzol, 2 small incisions were made with  introduction of a plastic cannula superior to the plantar fascia which  was well visualized with the introduced scope.  We then placed the Topaz  device from the opposite portal and performed  stimulation at several  locations about the plantar fascia in hopes of stimulating angiogenesis.  I perforated this in approximately 20 spots.  I then used small  Metzenbaum scissors to release the most medial one-half centimeter of  the plantar fascia.  Pressure was held on the heel and portals for  minute.  She had fairly a very little bleeding at this point.  Toes  remained pink and warm throughout the case.  We injected with some  Marcaine about the incision sites and with some Depo-Medrol deep into  the plantar fascial origin.  Simple sutures of nylon were used to  loosely reapproximate the portals followed by Adaptic and a dry gauze  dressing with a loose Ace wrap.  Estimated blood loss and intraoperative  fluids can be obtained from anesthesia records.  A tourniquet was placed  on the leg, but at no point was inflated.   DISPOSITION:  The patient was extubated in the operating room and taken  to recovery room in stable addition.  She was to go home same day and  follow up with me in the office less than a week.  I will contact her by  phone tonight.  Lubertha Basque Jerl Santos, M.D.  Electronically Signed     PGD/MEDQ  D:  05/28/2008  T:  05/28/2008  Job:  [id number]

## 2010-10-27 NOTE — Op Note (Signed)
NAMEHAYDON, Hannah Garrett              ACCOUNT NO.:  [id number]   MEDICAL RECORD NO.:  [id number]          PATIENT TYPE:  AMB   LOCATION:  SDS                          FACILITY:  [facility]   PHYSICIAN:  Antony Contras, MD     DATE OF BIRTH:  [birth year]   DATE OF PROCEDURE:  12/12/2006  DATE OF DISCHARGE:                               OPERATIVE REPORT   PREOPERATIVE DIAGNOSES:  1. Chronic maxillary and ethmoid sinusitis.  2. Inferior turbinate hypertrophy.   POSTOPERATIVE DIAGNOSES:  1. Chronic maxillary and ethmoid sinusitis.  2. Inferior turbinate hypertrophy.   PROCEDURES:  1. Bilateral maxillary antrostomies.  2. Bilateral anterior ethmoidectomies.  3. Bilateral inferior turbinate submucous resection.   SURGEON:  Excell Seltzer. Jenne Pane, M.D.   ANESTHESIA:  General endotracheal anesthesia.   COMPLICATIONS:  None.   INDICATIONS:  The patient is a 45 year old white female who has a long  history of sinus problems and has had previous sinus surgery.  She has  allergies to grass and has been on multiple antibiotics for sinusitis in  the past several months.  She has chronic nasal congestion as well and  has a previous history of chronic oxymetazoline use.  Having a poor  response to medications, she presents to the operating room for surgical  management.   FINDINGS:  The inferior turbinates are enlarged.  The patient had  evidence of left anterior ethmoidectomy, with removal of the ethmoid  bulla.  Both maxillary ostia were narrow, with uncinate processes in  place.  The left anterior ethmoid air cells contained polypoid tissue.   DESCRIPTION OF PROCEDURE:  The patient was identified in the holding  room and, informed consent having been obtained, including discussion of  risks, benefits and alternatives, the patient was taken to the operating  room suite and put on the operating table in the supine position.  Anesthesia was induced, and the patient was intubated by the anesthesia  team without difficulty.  The eyes were lubricated, and the face was  prepped and draped in sterile fashion.  The eyes were taped closed with  Steri-Strips, and Afrin-soaked pledgets placed in both sides of the  nose.  After several minutes, the right-sided pledgets were removed, and  the right lateral nasal wall was injected with 1% lidocaine with  1:100,000 epinephrine in standard fashion.  The middle turbinate was  medialized using a Therapist, nutritional, and the uncinate process was elevated  off the lateral wall using a curved probe.  A backbiter was then used to  divide the uncinate process and was then removed using a straight  microdebrider.  An angled telescope was then used to evaluate the  maxillary ostium, and it was made wider by the and downfracturing the  remaining uncinate process and removing the tissue.  An upbiting Tru-Cut  was used to extend the ostium posteriorly.  The curved debrider was then  used to clean the edges and also remove any cyst material from within  the maxillary sinus.  After this, a 0-degree telescope was then used to  evaluate the ethmoid bulla, which was removed using a  straight  microdebrider.  Several of the ethmoid air cells were removed as well  with the microdebrider.  After this was completed, Afrin pledgets were  placed into the ethmoid cavity.  The left nasal passage was then  inspected using the 0-degree telescope.  The lateral nasal wall was  injected the same fashion and the middle turbinate medialized.  The  uncinate process was elevated and divided and removed with the  microdebrider.  An angled telescope was used to evaluate the maxillary  ostium which was widened using the microdebrider and upbiting Tru-Cuts.  There was an accessory maxillary ostium not far posterior from the  natural ostium.  These two ostia were made into one larger ostium.  The  microdebrider was then used to remove any cyst material from within the  sinus.  A 0-degree  telescope and a straight debrider were then used to  remove anterior ethmoid air cells including polypoid tissue.  After this  was completed, Afrin-soaked pledgets were placed into the ethmoid  cavity.  At this point, the inferior turbinates were injected with 1%  lidocaine with 1:100,000 epinephrine.  Stab incisions were made at the  anterior head of the turbinates using a 15 blade scalpel, and the soft  tissues were elevated off the underlying bone using a Therapist, nutritional.  The straight microdebrider was then inserted into the pocket and used to  remove the submucosal tissue, keeping the mucosa intact.  This was done  on both sides.  The mucosa was then redraped using a Therapist, nutritional.  The pledgets were removed from the ethmoid cavities, and the ethmoid  cavities were inspected.  A few small areas of touch-up were performed  using the microdebrider.  At this point, an Afrin pledget was placed in  each ethmoid cavity informed, and one was placed in each nasal cavity.  The throat was then suctioned, and the patient was turned back to  anesthesia for wake-up.  She was extubated and moved to the recovery  room in stable condition.  The pledgets were removed in the recovery  room.      Antony Contras, MD  Electronically Signed     DDB/MEDQ  D:  12/12/2006  T:  12/12/2006  Job:  [id number]

## 2010-10-30 NOTE — Op Note (Signed)
Hannah Garrett, Hannah Garrett                        ACCOUNT NO.:  [id number]   MEDICAL RECORD NO.:  [id number]                   PATIENT TYPE:  INP   LOCATION:  2895                                 FACILITY:  [facility]   PHYSICIAN:  Hewitt Shorts, M.D.            DATE OF BIRTH:  Jun 03, 1966   DATE OF PROCEDURE:  11/07/2003  DATE OF DISCHARGE:                                 OPERATIVE REPORT   PREOPERATIVE DIAGNOSIS:  C5-6 cervical disk herniation, cervical  spondylosis, cervical degenerative disk disease, and cervical radiculopathy.   POSTOPERATIVE DIAGNOSIS:  C5-6 cervical disk herniation, cervical  spondylosis, cervical degenerative disk disease, and cervical radiculopathy.   OPERATION PERFORMED:  Removal of anterior cervical plate at E9-3 and  subsequent C5-6 anterior cervical diskectomy and arthrodesis with iliac  crest allograft and tether cervical plating.   SURGEON:  Hewitt Shorts, M.D.   ASSISTANT:  Clydene Fake, M.D.   ANESTHESIA:  General endotracheal.   INDICATIONS FOR PROCEDURE:  The patient is a 45 year old woman who is status  post a C6-7 anterior cervical diskectomy and fusion two years ago.  She  presented this month with an acute right cervical radiculopathy secondary to  a right C5-6 cervical disk herniation.  Decision was made to remove the  plate at Y1-0 proceed with a C5-6 anterior cervical diskectomy and  arthrodesis.   DESCRIPTION OF PROCEDURE:  The patient was brought to the operating room and  placed under general endotracheal anesthesia.  The patient was placed in 10  pounds of halter traction.  The neck was prepped with Betadine soap and  solution and draped in sterile fashion.  A horizontal incision was made on  the left side of the neck.  Dissection was carried down through the  subcutaneous tissue and platysma with bipolar cautery and electrocautery  used to maintain hemostasis.  Dissection was carried out to an avascular  plane, leaving the   sternocleidomastoid, carotid artery and jugular vein  laterally and trachea and esophagus medially.  The ventral aspect of the  vertebral column identified and we were able to identify the plate at the C6-  7 level.  The overlying tissue was carefully dissected away and the four  screws and plate were removed.  We identified the disk space immediately  above the upper screws which represent the C5-6 disk space.  The overlying  annulus was incised and the disk space entered.  We proceeded with a  thorough diskectomy removing disk material with a variety of microcurets and  pituitary rongeurs.  The cartilaginous end plates of the corresponding  vertebrae were removed using microcurets and the Micro Max drill.  The  operating microscope was draped and brought into the field to provide  additional magnification, illumination and visualization and the remainder  of the decompression was performed using microdissection and microsurgical  technique.  Posterior osteophytic overgrowth was removed using the Micro Max  drill along with  2 mm Kerrison punch with a thin foot plate.  The posterior  longitudinal ligament was removed and the disk herniation was removed and we  were able to decompress the spinal canal and thecal sac as well as the  neural foramina and nerve roots.  Once the decompression was completed,  hemostasis was established with the use of Gelfoam soaked in Thrombin.  We  then removed all the Gelfoam and hemostasis remained established.  We  measured the height of the disk space and selected a 7 mm iliac crest  allograft.  It was hydrated in saline solution and placed in the  intervertebral disk space and countersunk.  We then selected an 18 mm tether  cervical plate and it was positioned over the fusion construct.  We used 4.0  x 13 mm screws using the old screw holes in C6.  New screw holes were  drilled at C5, drilled and then tapped.  All four screws were then placed,  secured and  fully tightened.  Notably the traction was discontinued once the  bone graft had been placed but prior to the plating procedure.  Once the  plate was secured, with a final tightener, the wound was irrigated with  bacitracin solution, checked for hemostasis once again.  An x-ray was taken  and showed the graft in good position with the screws in C5 in good  position.  We could not see the screws in C6 due to the patient's shoulders.  The wound was then closed in multiple layers.  The platysma was closed with  interrupted inverted 2-0 undyed Vicryl sutures.  Subcutaneous and  subcuticular layer were closed with interrupted inverted 3-0 undyed Vicryl  sutures and the skin edges were approximated with DermaBond.  The patient  tolerated the procedure well.  The estimated blood loss was 50 mL.  Sponge,  needle and instrument counts were correct.  Following surgery, the patient,  who had been taken out of traction earlier, was reversed from anesthetic,  placed in a soft cervical collar, extubated and transferred to the recovery  room for further care.                                               Hewitt Shorts, M.D.    RWN/MEDQ  D:  11/07/2003  T:  11/08/2003  Job:  [id number]

## 2010-10-30 NOTE — H&P (Signed)
NAME:  Hannah Garrett, Hannah Garrett                        ACCOUNT NO.:  [id number]   MEDICAL RECORD NO.:  [id number]                   PATIENT TYPE:  AMB   LOCATION:  DSC                                  FACILITY:  [facility]   PHYSICIAN:  Hermelinda Medicus, M.D.                DATE OF BIRTH:  [birth year]   DATE OF ADMISSION:  07/06/2002  DATE OF DISCHARGE:                                HISTORY & PHYSICAL   HISTORY OF PRESENT ILLNESS:  This patient is a 45 year old female whom I  first saw for sinusitis problems. 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  She has also had essentially two ear infections along with this with fluid  behind her tympanic membranes, and her history goes back to where she has  had two nasal fractures and two nasal surgeries.  Her septum is quite  straight, but her nasal airway is considerably less.  Her nasal structure is  much smaller than it was before this nasal fracture problem.  This was  several years ago that this was achieved.  With that situation, though, she  has had continuing sinusitis problems. She furthermore has been on Augmentin  and Z-Pak under my care.  She has used nasal sprays and decongestants and is  trying to reestablish her normal sinus status.  She most recently has been  on Levaquin.   PAST MEDICAL HISTORY:  Her past history is quite remarkable for the fact she  has had right knee surgery. She has had a T&A.  She has had nasal surgeries  above mentioned.  D&C, neck symptoms, C section, and a hysterectomy in 2003.  She also has had some asthma history probably related to her sinusitis.  She  has been told she has some fibromyalgia, and she has TMJ problem.   ALLERGIES:  She also has considerable allergies to VICODIN, CODEINE,  PERCOCET, and DARVOCET, all causing nausea and vomiting.  CODEINE apparently  will  cause some shortness of breath.   MEDICATIONS:  Zyrtec, Levaquin, and Skelaxin.   PHYSICAL EXAMINATION:  VITAL SIGNS:  Blood pressure 126/88, pulse 92,  temperature 96.8.  GENERAL:  Well nourished, well developed female in no acute distress.  HEENT:  Her ears are clear at this time.  Tympanic membranes are no longer  retracted.  She has no fluid.  Her oral cavity is clear with no ulceration  or mass.  Nose: Septum is straight, but it is quite small, and her  turbinates are quite large, blocking her nose considerably. She has some  history of drainage down the back of her throat.  With this, she has had  purulent material.  The Levaquin has improved this considerable.  Larynx is  clear.  True cords, false cords, epiglottis, base of tongue are  all within  normal limits.  No salivary gland abnormalities.  Teeth and gums are within  normal limits.  NECK:  Free of any thyromegaly, cervical adenopathy, or mass.  CHEST:  Clear.  No rales, rhonchi, or wheezes.  CARDIOVASCULAR:  No rubs, murmurs, or gallops  NEUROLOGIC:  Unremarkable.  True cords mobility, gag reflexes, tongue  mobility, EOMs, facial nerve, shoulder strength are all within normal  limits.   LABORATORY DATA:  During her workup, she has also had a CT scan completed  which has shown total opacification of her left maxillary, three-quarters  opacification of the right, and some opacification of anterior ethmoids.  She has furthermore had headache problems associated with her sinusitis.   INITIAL DIAGNOSIS:  1. Sinusitis, maxillary and ethmoid.  2. History of nasal fractures x 2 with nasal surgeries x 2.  3. Turbinate hypertrophy.  4. History of otitis media associated with sinusitis and asthma.  5. History of right knee surgery.  6. Tonsillectomy and adenoidectomy.  7. Dilatation and curettage.  8. Hysterectomy.  9. Cesarean section.  10.      Neck symptoms.  11.      History of fibromyalgia.  12.      History of multiple  allergies to CODEINE, VICODIN, PERCOCET, and     DARVOCET.                                               Hermelinda Medicus, M.D.    JC/MEDQ  D:  07/06/2002  T:  07/06/2002  Job:  [id number]   cc:   Ernestina Penna, M.D.  [address] [place]  [place] [address]  Fax: [phone]

## 2010-10-30 NOTE — Op Note (Signed)
NAME:  Hannah Garrett, Hannah Garrett                        ACCOUNT NO.:  [id number]   MEDICAL RECORD NO.:  [id number]                   PATIENT TYPE:  AMB   LOCATION:  DSC                                  FACILITY:  [facility]   PHYSICIAN:  Hermelinda Medicus, M.D.                DATE OF BIRTH:  12-01-1965   DATE OF PROCEDURE:  07/06/2002  DATE OF DISCHARGE:                                 OPERATIVE REPORT   PREOPERATIVE DIAGNOSES:  1. Bilateral maxillary and ethmoid sinusitis.  2. History of nasal trauma.  3. History of nasal surgery.   POSTOPERATIVE DIAGNOSES:  1. Bilateral maxillary and ethmoid sinusitis.  2. History of nasal trauma.  3. History of nasal surgery.   PROCEDURE:  1. Functional endoscopic sinus surgery.  2. Bilateral ethmoidectomies.  3. Bilateral maxillary sinus ostial enlargements with antrostomies and     culture.   SURGEON:  Hermelinda Medicus, M.D.   ANESTHESIA:  Local 1% Xylocaine with epinephrine, topical cocaine 200 mg,  with anesthesia standby MAC with Guadalupe Maple, M.D.   DESCRIPTION OF PROCEDURE:  The patient was placed in the supine position and  under local anesthesia, topical cocaine and 1% Xylocaine with epinephrine 6  mL.  The nose was carefully examined and it was very difficult to see within  this very small nose that had been previously traumatized and then operated.  The inferior turbinates were aggressively outfractured, and this was done  successfully to gain some more space.  The middle turbinates were also large  and these were reduced slightly, but no mucous membrane was removed.  There  was a small concha bullosa appearance on the left side, and both turbinates  were pushed medially in an effort to make more space as the ethmoid sinuses  were totally blocked by these turbinates.  Once this was achieved, further  anesthesia was instilled up by the ethmoids, and then we entered the left  ethmoid and removed some fluid and thickened membrane.  More  posterior  superior aspect of the ethmoid was in quite decent condition.  It was more  in the anterior and inferior.  Once this was achieved, then we tried to find  the natural ostium for the maxillary sinus, and this was blocked.  We did  find this with a curved suction and viewing, and then we opened this and we  obtained a considerable amount of mucopurulent-type serous fluid from her  left maxillary sinus.  We used the 0 degree scope and the angled BlakesleyCloyde Reams and then we increased using the side-biter and the angled Blakesley  to increase this natural ostium approximately five times its normal size.  In an effort to make sure the sinus drained perfectly well, we did an  antrostomy also and suctioned and looked within the sinus, which for the  mucous membrane was just thickened and grayish, and I would expect now that  it  would drain, the mucous membrane would revert back to a more normal  status.  We then approached the right side, and again the ethmoid was  blocked off with the turbinate, which we pushed medially and did not remove  any mucous membrane.  We then entered the ethmoid sinus and again found some  mucoid material and some thickened membrane inferiorly and anteriorly.  This  was opened and draining well.  The right maxillary sinus was similar to the  left where we entered the sinus through a pinhole opening, made it larger  approximately five times its normal size, suctioned the mucoserous-type  debris from it, and did an antrostomy also to make sure we got adequate  clearance.  We then viewed this with a 0 degree scope.  Then I worked with  the side-biter, the angled Blakesley-Wilde's and straight Blakesley-Wilde's,  and once this was completed we then placed Gelfilm up into the ethmoid sinus  to make sure that larger turbinate was pushed medial and held medial so that  we would not get re-scarring.  Gelfoam was then placed superiorly also and  then Telfa was  placed within the nose, and the patient tolerated the  procedure very well.  With the considerable pain medication allergies that  she has, we are going to give her Mepergan, which is essentially Demerol and  Phenergan, 50 mg q.6h., along with an antibiotic using doxycycline.  Her  follow-up will be today at 3:30 for packing removal, and then in one week,  three weeks, and six weeks.                                               Hermelinda Medicus, M.D.    JC/MEDQ  D:  07/06/2002  T:  07/06/2002  Job:  [id number]   cc:   Ernestina Penna, M.D.  [address] [place]  [place] [address]  Fax: [phone]

## 2010-10-30 NOTE — Op Note (Signed)
[place]. [facility]  Patient:    Hannah Garrett, Hannah Garrett Visit Number: [id number] MRN: [id number]          Service Type: DSU Location: [facility] Attending Physician:  Colbert Ewing Dictated by:   Loreta Ave, M.D. Proc. Date: 04/20/01 Admit Date:  04/20/2001                             Operative Report  PREOPERATIVE DIAGNOSIS:  Left knee traumatic chondromalacia patella, question patellofemoral instability.  POSTOPERATIVE DIAGNOSES: 1. 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 2. Reactive synovitis medially. 3. Flap tear posterolateral meniscus.  No ligamentous or patellofemoral    maltracking or instability.  PROCEDURE: 1. Left knee examination under anesthesia. 2. Arthroscopy with partial lateral meniscectomy. 3. Chondroplasty of the patella. 4. Partial synovectomy. 5. Excision of small medial plica.  ASSESSMENT:  Chondral injury medial femoral condyle.  SURGEON:  Loreta Ave, M.D.  ASSISTANT:  ______  ANESTHESIA:  General.  ESTIMATED BLOOD LOSS:  Minimal.  SPECIMENS:  None.  CULTURES:  None.  COMPLICATIONS:  None.  DRESSINGS:  Soft compressive.  PROCEDURE:  The patient was brought to the operating room and placed on the operating table in the supine position.  After adequate anesthesia had been obtained, both knees were examined.  Full motion, excellent stability both knees.  No patellofemoral significant maltracking, tethering or instability on the left operative knee.  The tourniquet leg holder applied.  The leg was prepped and draped in the usual sterile fashion.  Three portals were created, one superolateral, one medial, one lateral parapatellar.  Inflow catheter was introduced, knee distended.  Arthroscope was introduced and knee inspected. Small focal area Grade II chondromalacia peak of patella treated with chondroplasty.  Absolutely normal tracking without instability, maltracking  or tethering.  Hypertrophic synovitis with a small medial plica, both resected medial gutter.  The medial meniscus and medial compartment intact.  Cruciate ligaments intact to inspection probably a Lachmans.  Small flap tear, posterior lateral meniscus, saucerized out and tapered this smoothly.  No other chondral changes.  Entirety of the knee was then re-examined without significant findings except for an impaction chondral injury of the medial femoral condyle, very marginal, less than 1 cm in diameter impacted in without fraying or flaps of cartilage.  Did not significantly articulate with the patella or tibia.  Hemostasis with cautery at the site of plica excision. Instruments were slowly removed.  The portals of the knee were injected with Marcaine.  The portals were closed with 4-0 Nylon.  Sterile compressive dressing was applied.  Anesthesia was reversed.  The patient was brought to the recovery room.  Patient tolerated surgery well.  No complications. Dictated by:   Loreta Ave, M.D. Attending Physician:  Colbert Ewing DD:  04/20/01 TD:  04/22/01 Job: [id number]

## 2010-10-30 NOTE — Consult Note (Signed)
NAMEDEIJA, BUHRMAN NO.:  [id number]   MEDICAL RECORD NO.:  [id number]          PATIENT TYPE:  EMS   LOCATION:  ED                           FACILITY:  [facility]   PHYSICIAN:  Lorre Munroe., M.D.DATE OF BIRTH:  1965-09-25   DATE OF CONSULTATION:  07/19/2004  DATE OF DISCHARGE:  07/19/2004                                   CONSULTATION   CHIEF COMPLAINT:  Abdominal pain.   HISTORY OF PRESENT ILLNESS:  This is a 45 year old white female seen in the  emergency department because of acute onset of right lower quadrant  abdominal pain and significant leukocytosis, although there has not been a  fever.  This was not associated with gastrointestinal prodrome and she has  not had vomiting, may have felt slight nausea.  She was found to have  tenderness in the right lower quadrant and I was asked to see her to advise  about further workup or treatment.  The patient has had a hysterectomy.  She  does not think an appendectomy was done.  Not sure about status of ovaries.  No imaging studies have been done at this time.  White count 17,000.  No  previous similar episodes.  She specifically denies any epigastric or  central abdominal pain or any cramps or diarrhea.  General health good.  No  serious chronic problems.   PHYSICAL EXAMINATION:  VITAL SIGNS:  Temperature and vital signs  unremarkable as recorded by nursing staff.  GENERAL:  No acute distress.  CHEST:  Clear to auscultation.  ABDOMEN:  There is no distention.  Soft, moderate right lower quadrant  tenderness.  No definite rebound tenderness.  Bowel sounds active.  No  hernias.   IMPRESSION:  Acute abdominal pain.  I doubt appendicitis and recommend CT  scanning to look for other diagnoses and to rule out appendicitis.  I have  spoken to Dr. Jennette Kettle regarding this and this will be done.  I will be happy  to see the patient again if there is still doubt regarding the diagnosis or  need for surgery.      WB/MEDQ  D:  07/22/2004  T:  07/22/2004  Job:  [id number]

## 2010-10-30 NOTE — Op Note (Signed)
[facility]  Patient:    Hannah Garrett, Hannah Garrett Visit Number: [id number] MRN: [id number]          Service Type: DSU Location: 9300 [id number] Attending Physician:  Rhina Brackett Dictated by:   Duke Salvia. Marcelle Overlie, M.D. Proc. Date: 11/16/01 Admit Date:  11/16/2001 Discharge Date: 11/17/2001                             Operative Report  PREOPERATIVE DIAGNOSIS:       Menorrhagia.  POSTOPERATIVE DIAGNOSIS:      Menorrhagia.  OPERATION:                    Total vaginal hysterectomy.  SURGEON:                      Duke Salvia. Marcelle Overlie, M.D.  ASSISTANTWilley Blade, M.D.  ANESTHESIA:                   General endotracheal.  COMPLICATIONS:                None.  DRAIN:                        Foley catheter.  ESTIMATED BLOOD LOSS:         200 cc.  PROCEDURE/FINDINGS:           The patient was taken into the operating room. After an adequate level of general anesthesia was obtained, with the patients legs in the stirrups, the cranium and vagina prepped and draped in the usual manner for vaginal procedures. Bladder was drained and UA carried out. The uterus was normal size, mobile. Adnexa negative. The weighted speculum was positioned, cervix grasped with the tenaculum. The scalpel was used to make an incision at the cervical vaginal junction. Posterior culdotomy performed without difficulty. The left uterosacral ligament was clamped, coagulated, and cut with ligature coagulation system. Same repeated on the opposite side. The bladder was advanced superiorly. With blunt dissection, the peritoneum was identified anteriorly and entered sharply without complication. A retractor was then used to gently elevate the bladder out of the field in sequential manner staying close to the uterus. The cardinal ligament, uterine vascular pedicle, and upper broad ligament pedicles were clamped, coagulated, and divided with the ligature system.  The fundus of the uterus is then delivered posteriorly. The utero-ovarian pedicles were clamped with Haney clamps and divided, first free-tie followed by a suture ligature of 0 Dexon after the specimen was removed. Ovaries were normal. The previously placed Filshie clips were excised from the tube by clamping with a Kelly clamp, excising, and a free-tie ligature. All major pedicles were inspected and noted to be hemostatic, the cuffs closed with a running-lock 2-0 Dexon suture. Prior to closure, sponge, instrument, and needle counts reported correct x2. McCalls colpoplasty suture was placed across from the left uterosacral ligament, posterior peritoneum to the right uterosacral ligament and tied down to support the cul-de-sac. The vaginal cuffs closed right to left with a figure-of-eight 2-0 Monocryl suture. Foley was positioned draining clear urine. She tolerated this well and went to recovery in good condition. Dictated by:   Duke Salvia. Marcelle Overlie, M.D. Attending Physician:  Rhina Brackett DD:  11/16/01 TD:  11/18/01 Job:  [id number]

## 2010-10-30 NOTE — Op Note (Signed)
[place]. [facility]  Patient:    Hannah Garrett, Hannah Garrett Visit Number: [id number] MRN: [id number]          Service Type: SUR Location: 3000 3021 [id number] Attending Physician:  Barton Fanny Dictated by:   Hewitt Shorts, M.D. Proc. Date: 08/30/01 Admit Date:  08/30/2001 Discharge Date: 08/31/2001                             Operative Report  PREOPERATIVE DIAGNOSIS:  C6-7 cervical disk degeneration, degenerative disk disease, spondylosis and left cervical radiculopathy.  POSTOPERATIVE DIAGNOSIS:  C6-7 cervical disk degeneration, degenerative disk disease, spondylosis and left cervical radiculopathy.  OPERATION PERFORMED:  C6-7 anterior cervical diskectomy and arthrodesis with iliac crest allograft and tether cervical plating.  SURGEON:  Hewitt Shorts, M.D.  ASSISTANT:  Payton Doughty, M.D.  ANESTHESIA:  General endotracheal.  INDICATIONS FOR PROCEDURE:  The patient is a 45 year old woman who presented with a left cervical radiculopathy.  She was found to have a C6-7 cervical disk herniation to the left side superimposed on underlying degenerative disk disease and spondylosis.  Decision was made to proceed with a C6-7 anterior cervical diskectomy and arthrodesis.  DESCRIPTION OF PROCEDURE:  The patient was brought to the operating room and placed under general endotracheal anesthesia.  The patient was placed in 10 pounds of halter traction. The neck was prepped with Betadine soap and solution and draped in sterile fashion.  A horizontal incision was made on the left side of the neck.  The line of the incision was infiltrated with local anesthetic with epinephrine.  The incision was made with Shaw scalpel at a temperature of 120.  Dissection was carried down through subcutaneous tissues and platysma.  The dissection was carried out to an avascular plane leaving the sternocleidomastoid, carotid artery and jugular vein laterally and trachea and  esophagus medially.  The ventral aspect of the vertebral columns was identified and localizing x-ray taken and the C6-7 intervertebral disk space identified.  Diskectomy was begun with incision of the annulus and continued with microcurets and pituitary rongeurs.  The cartilaginous end plates of the corresponding vertebral bodies were removed using microcurets and the Micromax drill.  The microscope was draped and brought into the field to provide additional magnification, illumination and visualization.  The remainder of the procedure was performed using microdissection and microsurgical technique. Posterior osteophytic overgrowth was removed using the Micromax drill and a 2 mm Kerrison punch with thin foot plates.  The posterior longitudinal ligament was markedly thickened.  This was carefully opened and removed and the disk herniation was similarly removed.  Foraminotomy was performed bilaterally and in the end, good decompression of the spinal canal, foramina and thecal sac and nerve roots was achieved bilaterally.  Once the diskectomy was completed and hemostasis established, we proceeded with arthrodesis.  We resected a wedge of iliac crest and allograft.  It was cut and shaped to size and positioned in the intervertebral disk space and countersunk.  We then selected a 14 mm tether cervical plate.  It was positioned over the fusion construct and secured to the vertebrae at each level with a pair of 4.0 by 13 mm screws.  Each screw hole was drilled, tapped and screw placed.  Final tightening was done of all four screws.  An x-ray was taken that showed the graft and plate and screws in good position.  The alignment was good.  The wound was  irrigated with bacitracin solution and checked for hemostasis which was established and confirmed and then we proceeded with closure.  The platysma was closed with interrupted inverted 2-0 undyed Vicryl sutures and the subcutaneous and subcuticular  layer were closed with interrupted inverted 3-0 undyed Vicryl sutures and the skin edges were approximated with Dermabond. The patient tolerated the procedure well.  Estimated blood loss for this procedure was 50 cc.  Sponge, needle and instrument counts were correct.  The patient had been removed from cervical traction once the bone had been place and prior to the plating.  We placed the patient in a soft cervical collar and then she was reversed from anesthetic.  Extubated and was transferred to the recovery room for further care where she was noted to be moving all four extremities. Dictated by:   Hewitt Shorts, M.D. Attending Physician:  Barton Fanny DD:  08/30/01 TD:  08/31/01 Job: [id number]

## 2010-10-30 NOTE — H&P (Signed)
[facility]  Patient:    Hannah Garrett, Hannah Garrett Visit Number: [id number] MRN: [id number]          Service Type: DSU Location: 9300 [id number] Attending Physician:  Rhina Brackett Dictated by:   Duke Salvia. Marcelle Overlie, M.D. Admit Date:  11/16/2001                           History and Physical  CHIEF COMPLAINT:              Menometrorrhagia.  HISTORY OF PRESENT ILLNESS:   A 45 year old G6, P4, previous tubal ligation in 2001.  Pelvic findings at the time were unremarkable.  She has had persistent menometrorrhagia, and underwent sonohistogram in January 2003 with normal findings.  We have discussed a number of options including OCPs, endometrial ablation, and a TVH which she has a strong preference for due to the severity of her bleeding.  This procedure including risks of bleeding, infection, transfusion, and possible need for open or additional surgery were all discussed with her which she understands and accepts.  ALLERGIES:                    CODEINE.  PAST SURGICAL HISTORY:        Cesarean section in 1988 followed by two vaginal deliveries and two SABs.  Other surgery - knee surgery, nose reconstruction, and tonsillectomy.  REVIEW OF SYSTEMS:            Significant for a history of postpartum depression, otherwise unremarkable.  PHYSICAL EXAMINATION:  VITAL SIGNS:                  Temperature 98.2, blood pressure 120/72.  HEENT:                        Unremarkable.  NECK:                         Supple without mass.  LUNGS:                        Clear.  CARDIOVASCULAR:               Regular rate and rhythm without murmurs, rubs, or gallops.  BREASTS:                      Without masses.  ABDOMEN:                      Soft, flat, and nontender.  Well-healed midline incision.  PELVIC EXAMINATION:           Normal external genitalia.  Vagina and cervix clear.  Uterus is mid position and normal size.  Adnexa negative.  Uterus  is mobile.  IMPRESSION:                   Persistent menometrorrhagia.  PLAN:                         TVH.  The procedure and risks reviewed as above. Dictated by:   Duke Salvia. Marcelle Overlie, M.D. Attending Physician:  Rhina Brackett DD:  11/15/01 TD:  11/16/01 Job: [id number]

## 2010-10-30 NOTE — Procedures (Signed)
NAME:  Hannah Garrett, Hannah Garrett              ACCOUNT NO.:  [id number]   MEDICAL RECORD NO.:  [id number]          PATIENT TYPE:  OUT   LOCATION:  SLEEP CENTER                 FACILITY:  [facility]   PHYSICIAN:  Clinton D. Maple Hudson, M.D. DATE OF BIRTH:  January 18, 1966   DATE OF STUDY:                              NOCTURNAL POLYSOMNOGRAM   REFERRING PHYSICIAN:  Dr. Rudi Heap.   DATE OF STUDY:  May 20, 2005.   INDICATION FOR STUDY:  Insomnia with sleep apnea.   EPWORTH SLEEPINESS SCORE:  18/24.   BMI:  29.   WEIGHT:  170 pounds.   SLEEP ARCHITECTURE:  Total sleep time 385 minutes with sleep efficiency 88%.  Stage I 5%, stage II 56%, stage III and IV 18%, REM 21% of total sleep time.  Sleep latency 37 minutes, REM latency 52 minutes, awake after sleep onset 22  minutes, arousal index 9. No bedtime medication taken.   RESPIRATORY DATA:  Apnea/hypopnea index (AHI, RDI) 1.1 obstructive events  per hour which is within normal limits (2/5 per hour). There were 4  obstructive apneas and 3 hypopneas. All events were while sleeping supine.  REM AHI 2.9 per hour.   OXYGEN DATA:  Mild snoring with oxygen desaturation to a nadir of 93%. Mean  oxygen saturation through the study was 97% on room air.   CARDIAC DATA:  Normal sinus rhythm.   MOVEMENT/PARASOMNIA:  A total of 180 limb jerks were reported of which 10  were associated with arousal or awakening for periodic limb movement with  arousal index of 1.6 per hour which is probably not significant. Bathroom  x1.   IMPRESSION/RECOMMENDATIONS:  1.  Unremarkable sleep time and architecture for the sleep center      environment with no medication taken. Sleep was disturbed minimally by      occasional leg jerks (1.6 per hour) and respiratory events (1.1 per      hour). Significant sleep disturbance and pathologic numbers of events      were not seen.  2.  Occasional obstructive sleep disordered breathing, AHI 1.1 per hour with      mild snoring and  normal      oxygenation.  3.  Periodic limb movement with arousal, minimal, 1.6 per hour.      Clinton D. Maple Hudson, M.D.  Diplomate, Biomedical engineer of Sleep Medicine  Electronically Signed     CDY/MEDQ  D:  05/23/2005 12:36:53  T:  05/24/2005 00:36:14  Job:  [id number]

## 2010-11-12 ENCOUNTER — Telehealth: Payer: Self-pay | Admitting: Family Medicine

## 2010-11-12 NOTE — Telephone Encounter (Signed)
Patient has been advised to pick up titration pack, and restart at the beginning per Dr Abner Greenspan since she has not taken any medication for 5 days

## 2010-11-12 NOTE — Telephone Encounter (Signed)
Pls contact patient.

## 2010-11-13 NOTE — Telephone Encounter (Signed)
Patient picked up 11/12/10

## 2010-11-16 ENCOUNTER — Ambulatory Visit: Payer: BC Managed Care – PPO | Admitting: Family Medicine

## 2010-11-16 DIAGNOSIS — Z0289 Encounter for other administrative examinations: Secondary | ICD-10-CM

## 2010-11-17 ENCOUNTER — Encounter: Payer: Self-pay | Admitting: Family Medicine

## 2010-11-17 ENCOUNTER — Ambulatory Visit (INDEPENDENT_AMBULATORY_CARE_PROVIDER_SITE_OTHER): Payer: BC Managed Care – PPO | Admitting: Family Medicine

## 2010-11-17 VITALS — BP 127/90 | HR 93 | Temp 98.0°F | Ht 63.5 in | Wt 183.1 lb

## 2010-11-17 DIAGNOSIS — J019 Acute sinusitis, unspecified: Secondary | ICD-10-CM

## 2010-11-17 DIAGNOSIS — F17200 Nicotine dependence, unspecified, uncomplicated: Secondary | ICD-10-CM

## 2010-11-17 DIAGNOSIS — Z720 Tobacco use: Secondary | ICD-10-CM

## 2010-11-17 DIAGNOSIS — J309 Allergic rhinitis, unspecified: Secondary | ICD-10-CM

## 2010-11-17 DIAGNOSIS — J329 Chronic sinusitis, unspecified: Secondary | ICD-10-CM

## 2010-11-17 MED ORDER — GUAIFENESIN ER 600 MG PO TB12: 1200.0000 mg | ORAL_TABLET | Freq: Two times a day (BID) | ORAL | Status: DC

## 2010-11-17 MED ORDER — CEFDINIR 300 MG PO CAPS: 300.0000 mg | ORAL_CAPSULE | Freq: Two times a day (BID) | ORAL | Status: AC

## 2010-11-17 NOTE — Patient Instructions (Signed)
Sinusitis Sinuses are air pockets within the bones of your face. The growth of bacteria within a sinus leads to infection. Infection keeps the sinuses from draining. This infection is called sinusitis. SYMPTOMS There will be different areas of pain depending on which sinuses have become infected.  The maxillary sinuses often produce pain beneath the eyes.   Frontal sinusitis may cause pain in the middle of the forehead and above the eyes.  Other problems (symptoms) include:  Toothaches.   Colored, pus-like (purulent) drainage from the nose.   Any swelling, warmth, or tenderness over the sinus areas may be signs of infection.  TREATMENT Sinusitis is most often determined by an exam and you may have x-rays taken. If x-rays have been taken, make sure you obtain your results. Or find out how you are to obtain them. Your caregiver may give you medications (antibiotics). These are medications that will help kill the infection. You may also be given a medication (decongestant) that helps to reduce sinus swelling.  HOME CARE INSTRUCTIONS  Only take over-the-counter or prescription medicines for pain, discomfort, or fever as directed by your caregiver.   Drink extra fluids. Fluids help thin the mucus so your sinuses can drain more easily.   Applying either moist heat or ice packs to the sinus areas may help relieve discomfort.   Use saline nasal sprays to help moisten your sinuses. The sprays can be found at your local drugstore.  SEEK IMMEDIATE MEDICAL CARE IF YOU DEVELOP:  High fever that is still present after two days of antibiotic treatment.   Increasing pain, severe headaches, or toothache.   Nausea, vomiting, or drowsiness.   Unusual swelling around the face or trouble seeing.  MAKE SURE YOU:   Understand these instructions.   Will watch your condition.   Will get help right away if you are not doing well or get worse.  Document Released: 05/31/2005 Document Re-Released:  05/13/2008 [facility] Patient Information 2011 [place], [place].  Start a yogurt such as a Activia yogurt daily and increase hydration

## 2010-11-18 ENCOUNTER — Encounter: Payer: Self-pay | Admitting: Family Medicine

## 2010-11-18 NOTE — Progress Notes (Signed)
Hannah Garrett [id number] 10-30-1965 11/18/2010      Progress Note-Follow Up  Subjective  Chief Complaint  Chief Complaint  Patient presents with  . Ear Problem    X  4 days- more the left ear  . Nasal Congestion    X 4 days    HPI  Patient is in today with a roughly 3-4 day history of worsening facial pressure. She has a long history of recurrent sinusitis and is now struggling with green rhinorrhea, nasal congestion, left ear pain, malaise, myalgias. She denies fevers, chills, chest pain, palpitations, shortness of breath, GI or GU concerns at this time. She has not tried any over-the-counter medications thus far. Unfortunately she does continue to smoke although she does say she has been trying to cut back in the recent past  Past Medical History  Diagnosis Date  . Allergy     seasonal  . Hyperlipidemia   . Positive TB test   . Arthritis   . Asthma   . IBS (irritable bowel syndrome)   . Fibromyalgia   . Asthma, exercise induced 10/20/2010  . Depression 10/20/2010  . Right shoulder pain 10/20/2010  . Neck pain, chronic 10/20/2010  . Allergic rhinitis 10/21/2010  . Recurrent sinusitis 10/21/2010  . Irritable bowel syndrome (IBS) 10/21/2010  . BCC (basal cell carcinoma), face 10/21/2010  . Endometriosis 10/21/2010  . Urge incontinence 10/21/2010  . Plantar fasciitis 10/21/2010  . Tobacco abuse 10/21/2010  . Back pain 10/21/2010  . Headache, migraine 10/21/2010  . Headache, tension-type 10/21/2010  . 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 Overweight 10/21/2010  . Fatigue 10/21/2010  . Peripheral edema 10/21/2010    Past Surgical History  Procedure Date  . Tonsillectomy and adenoidectomy 1974  . Right knee 1987  . Cesarean section 1988  . Dilation and curettage of uterus 1994 and 1995  . Left knee 2002  . Neck surgery 2003 & 2005  . Left shoulder 2004  . Abdominal hysterectomy 2004  . Shock wave on right foot 2009  . Left foot surgery 2010  . Nose surgery 14 and 45 yrs old  . Sinus surgeries 2005 &2007     Family History  Problem Relation Age of Onset  . Hypertension Mother   . Diabetes Mother     borderline  . Asthma Mother   . Arthritis Mother   . Hypertension Father   . Arthritis Father   . Cancer Father     prostate  . Heart disease Father     s/p MI  . Asthma Daughter   . Migraines Daughter   . GER disease Daughter   . Cancer Son     CA- calcification, right lower ribs was causing significant breathing trouble after returning from [place]  . Heart murmur Son   . Heart disease Maternal Grandmother   . Diabetes Maternal Grandfather   . Cancer Paternal Grandmother     stomach  . Asthma Daughter   . COPD Brother     smoker, lung disease    History   Social History  . Marital Status: Married    Spouse Name: N/A    Number of Children: N/A  . Years of Education: N/A   Occupational History  . Not on file.   Social History Main Topics  . Smoking status: Current Some Day Smoker    Types: Cigarettes  . Smokeless tobacco: Never Used  . Alcohol Use: Yes     occasional  . Drug Use: No  .  Sexually Active: Yes   Other Topics Concern  . Not on file   Social History Narrative  . No narrative on file    Current Outpatient Prescriptions on File Prior to Visit  Medication Sig Dispense Refill  . albuterol (VENTOLIN HFA) 108 (90 BASE) MCG/ACT inhaler Inhale 2 puffs into the lungs every 6 (six) hours as needed for wheezing.  1 Inhaler  5  . fluticasone (FLONASE) 50 MCG/ACT nasal spray 2 sprays by Nasal route daily.  16 g  2  . meperidine (DEMEROL) 50 MG tablet Take 50 mg by mouth every 4 (four) hours as needed.        . Milnacipran HCl (SAVELLA TITRATION PACK) 12.5 & 25 & 50 MG MISC Use titration pack as directed on package  1 each  0  . Milnacipran HCl (SAVELLA) 50 MG TABS Take 1 tablet (50 mg total) by mouth 2 (two) times daily.  60 tablet  1  . promethazine (PHENERGAN) 25 MG tablet Take 25 mg by mouth every 6 (six) hours as needed.        . ranitidine (ZANTAC) 300 MG  tablet Take 1 tablet (300 mg total) by mouth at bedtime.  30 tablet  2  . spironolactone (ALDACTONE) 25 MG tablet 1 tab po daily prn edema/bloating  30 tablet  3    Allergies  Allergen Reactions  . Codeine Hives  . Darvocet (Propoxyphene N-Acetaminophen) Nausea And Vomiting  . Percocet (Oxycodone-Acetaminophen) Nausea And Vomiting    Review of Systems  Review of Systems  Constitutional: Negative for fever and malaise/fatigue.  HENT: Negative for congestion.   Eyes: Positive for pain. Negative for discharge.  Respiratory: Negative for shortness of breath.   Cardiovascular: Negative for chest pain, palpitations and leg swelling.  Gastrointestinal: Negative for nausea, abdominal pain and diarrhea.  Genitourinary: Negative for dysuria.  Musculoskeletal: Negative for falls.  Skin: Negative for rash.  Neurological: Negative for loss of consciousness and headaches.  Endo/Heme/Allergies: Negative for polydipsia.  Psychiatric/Behavioral: Negative for depression and suicidal ideas. The patient is not nervous/anxious and does not have insomnia.     Objective  BP 127/90  Pulse 93  Temp(Src) 98 F (36.7 C) (Oral)  Ht 5' 3.5" (1.613 m)  Wt 183 lb 1.9 oz (83.063 kg)  BMI 31.93 kg/m2  SpO2 99%  Physical Exam  Physical Exam  Constitutional: She is oriented to person, place, and time and well-developed, well-nourished, and in no distress. No distress.  HENT:  Head: Normocephalic and atraumatic.  Right Ear: External ear normal.  Left Ear: External ear normal.  Mouth/Throat: No oropharyngeal exudate.       Left TM erythematous and dull. Right wnl. Maxillary sinuses tender to palp b/l. Nasal mucosa boggy and erythematous.  Eyes: Conjunctivae and EOM are normal. Left eye exhibits no discharge. No scleral icterus.  Neck: Neck supple. No JVD present. Tracheal deviation present. No thyromegaly present.  Cardiovascular: Normal rate, regular rhythm, normal heart sounds and intact distal  pulses.   No murmur heard. Pulmonary/Chest: Breath sounds normal. She is in respiratory distress. She has no wheezes. She has no rales.  Abdominal: She exhibits no distension and no mass. There is tenderness. There is guarding.  Musculoskeletal: She exhibits edema. She exhibits no tenderness.  Lymphadenopathy:    She has no cervical adenopathy.  Neurological: She is alert and oriented to person, place, and time.  Skin: Skin is warm and dry. No rash noted. She is not diaphoretic. No erythema.  Psychiatric: Memory,  affect and judgment normal.    Lab Results  Component Value Date   TSH 1.95 10/21/2010   Lab Results  Component Value Date   WBC 9.7 10/21/2010   HGB 14.4 10/21/2010   HCT 41.8 10/21/2010   MCV 90.3 10/21/2010   PLT 286.0 10/21/2010   Lab Results  Component Value Date   CREATININE 0.7 10/21/2010   BUN 9 10/21/2010   NA 138 10/21/2010   K 4.2 10/21/2010   CL 105 10/21/2010   CO2 26 10/21/2010   Lab Results  Component Value Date   ALT 15 10/21/2010   AST 17 10/21/2010   ALKPHOS 72 10/21/2010   BILITOT 0.5 10/21/2010   Lab Results  Component Value Date   CHOL 255* 10/21/2010   Lab Results  Component Value Date   HDL 37.90* 10/21/2010   No results found for this basename: [facility]   Lab Results  Component Value Date   TRIG 284.0* 10/21/2010   Lab Results  Component Value Date   CHOLHDL 7 10/21/2010     Assessment & Plan  Recurrent sinusitis 3 a 4 day history of worsening facial congestion tooth pain greenish rhinorrhea left-sided ear pain. She's had fatigue and malaise as well. Exam consistent with a left otitis media and recurrent sinusitis is placed on Omnicef twice a day asked to take Mucinex twice a day, push clear fluids, increase rest and return if symptoms do not improve  Tobacco abuse Unfortunately continues to smoke. He is encouraged once again to attempt cessation. She has cut down somewhat and agrees to continue trying was counseled for 3 minutes regarding the need for  cessation  Allergic rhinitis Encouraged to add Zyrtec once to twice daily whenever she has symptoms that start  To flare ad encouraged to use Flonase daily

## 2010-11-18 NOTE — Assessment & Plan Note (Signed)
Unfortunately continues to smoke. He is encouraged once again to attempt cessation. She has cut down somewhat and agrees to continue trying was counseled for 3 minutes regarding the need for cessation

## 2010-11-18 NOTE — Assessment & Plan Note (Signed)
3 a 4 day history of worsening facial congestion tooth pain greenish rhinorrhea left-sided ear pain. She's had fatigue and malaise as well. Exam consistent with a left otitis media and recurrent sinusitis is placed on Omnicef twice a day asked to take Mucinex twice a day, push clear fluids, increase rest and return if symptoms do not improve

## 2010-11-18 NOTE — Assessment & Plan Note (Signed)
Encouraged to add Zyrtec once to twice daily whenever she has symptoms that start  To flare ad encouraged to use Flonase daily

## 2010-12-25 ENCOUNTER — Ambulatory Visit: Payer: BC Managed Care – PPO | Admitting: Family Medicine

## 2011-02-10 ENCOUNTER — Encounter: Payer: Self-pay | Admitting: Family Medicine

## 2011-02-10 ENCOUNTER — Ambulatory Visit (INDEPENDENT_AMBULATORY_CARE_PROVIDER_SITE_OTHER): Payer: BC Managed Care – PPO | Admitting: Family Medicine

## 2011-02-10 VITALS — BP 137/94 | HR 90 | Temp 98.0°F | Ht 63.5 in | Wt 181.4 lb

## 2011-02-10 DIAGNOSIS — F909 Attention-deficit hyperactivity disorder, unspecified type: Secondary | ICD-10-CM

## 2011-02-10 DIAGNOSIS — R5383 Other fatigue: Secondary | ICD-10-CM

## 2011-02-10 DIAGNOSIS — Z720 Tobacco use: Secondary | ICD-10-CM

## 2011-02-10 DIAGNOSIS — F17200 Nicotine dependence, unspecified, uncomplicated: Secondary | ICD-10-CM

## 2011-02-10 DIAGNOSIS — J029 Acute pharyngitis, unspecified: Secondary | ICD-10-CM

## 2011-02-10 DIAGNOSIS — R41840 Attention and concentration deficit: Secondary | ICD-10-CM | POA: Insufficient documentation

## 2011-02-10 DIAGNOSIS — R5381 Other malaise: Secondary | ICD-10-CM

## 2011-02-10 DIAGNOSIS — G4700 Insomnia, unspecified: Secondary | ICD-10-CM

## 2011-02-10 DIAGNOSIS — R413 Other amnesia: Secondary | ICD-10-CM

## 2011-02-10 HISTORY — DX: Attention-deficit hyperactivity disorder, unspecified type: F90.9

## 2011-02-10 HISTORY — DX: Insomnia, unspecified: G47.00

## 2011-02-10 MED ORDER — METHYLPHENIDATE HCL 10 MG PO TABS: 10.0000 mg | ORAL_TABLET | Freq: Every day | ORAL | Status: DC

## 2011-02-10 MED ORDER — ALPRAZOLAM 0.25 MG PO TABS: 0.2500 mg | ORAL_TABLET | Freq: Every evening | ORAL | Status: AC | PRN

## 2011-02-10 NOTE — Assessment & Plan Note (Addendum)
Patient struggling with worsening fatigue, insomnia and feeling overwhelmed. Will also call [place] to see if this helps and is encouraged to try and manage 7-8 hours of sleep and good sleep hygiene is discussed at length we'll give her alprazolam 0.25 mg 1-2 tabs to use at bedtime to see if that helps her to sleep more soundly throughout the night.

## 2011-02-10 NOTE — Progress Notes (Signed)
Hannah Garrett [id number] Apr 03, 1966 02/10/2011      Progress Note-Follow Up  Subjective  Chief Complaint  Chief Complaint  Patient presents with  . Sore Throat    X 2 days-"wraspy" voice for several days    HPI  Patient is a 44 he was admitted with several concerns. She has had 2 days now with sore throat but no fevers, chills, abdominal pain or increased congestion. She does have some mild nasal congestion that is ongoing. She does have some mild myalgias but that is also ongoing. No cough, chest pain, shortness of breath, palpitations, GI or GU complaints. Her other complaint is of poor sleep. Waking up several times a night and having trouble getting 7 hours a night due to time. She is struggling with excessive fatigue and also consider that difficulty concentrating. She does have fibromyalgia and is also worried that perhaps he has a some form of ADD. She has tried Ambien in the past with insomnia but it caused excessive sedation the next day has not tried anything recently  Past Medical History  Diagnosis Date  . Allergy     seasonal  . Hyperlipidemia   . Positive TB test   . Arthritis   . Asthma   . IBS (irritable bowel syndrome)   . Fibromyalgia   . Asthma, exercise induced 10/20/2010  . Depression 10/20/2010  . Right shoulder pain 10/20/2010  . Neck pain, chronic 10/20/2010  . Allergic rhinitis 10/21/2010  . Recurrent sinusitis 10/21/2010  . Irritable bowel syndrome (IBS) 10/21/2010  . BCC (basal cell carcinoma), face 10/21/2010  . Endometriosis 10/21/2010  . Urge incontinence 10/21/2010  . Plantar fasciitis 10/21/2010  . Tobacco abuse 10/21/2010  . Back pain 10/21/2010  . Headache, migraine 10/21/2010  . Headache, tension-type 10/21/2010  . 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 Overweight 10/21/2010  . Fatigue 10/21/2010  . Peripheral edema 10/21/2010  . Insomnia 02/10/2011  . Poor concentration 02/10/2011    Past Surgical History  Procedure Date  . Tonsillectomy and adenoidectomy 1974  . Right knee 1987   . Cesarean section 1988  . Dilation and curettage of uterus 1994 and 1995  . Left knee 2002  . Neck surgery 2003 & 2005  . Left shoulder 2004  . Abdominal hysterectomy 2004  . Shock wave on right foot 2009  . Left foot surgery 2010  . Nose surgery 14 and 45 yrs old  . Sinus surgeries 2005 &2007    Family History  Problem Relation Age of Onset  . Hypertension Mother   . Diabetes Mother     borderline  . Asthma Mother   . Arthritis Mother   . Hypertension Father   . Arthritis Father   . Cancer Father     prostate  . Heart disease Father     s/p MI  . Asthma Daughter   . Migraines Daughter   . GER disease Daughter   . Cancer Son     CA- calcification, right lower ribs was causing significant breathing trouble after returning from [place]  . Heart murmur Son   . Heart disease Maternal Grandmother   . Diabetes Maternal Grandfather   . Cancer Paternal Grandmother     stomach  . Asthma Daughter   . COPD Brother     smoker, lung disease    History   Social History  . Marital Status: Married    Spouse Name: N/A    Number of Children: N/A  . Years of Education: N/A  Occupational History  . Not on file.   Social History Main Topics  . Smoking status: Current Some Day Smoker    Types: Cigarettes  . Smokeless tobacco: Never Used  . Alcohol Use: Yes     occasional  . Drug Use: No  . Sexually Active: Yes   Other Topics Concern  . Not on file   Social History Narrative  . No narrative on file    Current Outpatient Prescriptions on File Prior to Visit  Medication Sig Dispense Refill  . albuterol (VENTOLIN HFA) 108 (90 BASE) MCG/ACT inhaler Inhale 2 puffs into the lungs every 6 (six) hours as needed for wheezing.  1 Inhaler  5  . fluticasone (FLONASE) 50 MCG/ACT nasal spray 2 sprays by Nasal route daily.  16 g  2  . meperidine (DEMEROL) 50 MG tablet Take 50 mg by mouth every 4 (four) hours as needed.        . promethazine (PHENERGAN) 25 MG tablet Take 25 mg by  mouth every 6 (six) hours as needed.        . ranitidine (ZANTAC) 300 MG tablet Take 1 tablet (300 mg total) by mouth at bedtime.  30 tablet  2  . Milnacipran HCl (SAVELLA) 50 MG TABS Take 1 tablet (50 mg total) by mouth 2 (two) times daily.  60 tablet  1  . spironolactone (ALDACTONE) 25 MG tablet 1 tab po daily prn edema/bloating  30 tablet  3    Allergies  Allergen Reactions  . Codeine Hives  . Darvocet (Propoxyphene N-Acetaminophen) Nausea And Vomiting  . Percocet (Oxycodone-Acetaminophen) Nausea And Vomiting    Review of Systems  Review of Systems  Constitutional: Positive for malaise/fatigue. Negative for fever.  HENT: Positive for sore throat. Negative for congestion.   Eyes: Negative for discharge.  Respiratory: Negative for shortness of breath.   Cardiovascular: Negative for chest pain, palpitations and leg swelling.  Gastrointestinal: Negative for nausea, abdominal pain and diarrhea.  Genitourinary: Negative for dysuria.  Musculoskeletal: Negative for falls.  Skin: Negative for rash.  Neurological: Negative for loss of consciousness and headaches.  Endo/Heme/Allergies: Negative for polydipsia.  Psychiatric/Behavioral: Positive for depression. Negative for suicidal ideas. The patient has insomnia. The patient is not nervous/anxious.     Objective  BP 137/94  Pulse 90  Temp(Src) 98 F (36.7 C) (Oral)  Ht 5' 3.5" (1.613 m)  Wt 181 lb 6.4 oz (82.283 kg)  BMI 31.63 kg/m2  SpO2 98%  Physical Exam  Physical Exam  Constitutional: She is oriented to person, place, and time and well-developed, well-nourished, and in no distress. No distress.  HENT:  Head: Normocephalic and atraumatic.  Eyes: Conjunctivae are normal.  Neck: Neck supple. No thyromegaly present.  Cardiovascular: Normal rate, regular rhythm and normal heart sounds.   No murmur heard. Pulmonary/Chest: Effort normal and breath sounds normal. She has no wheezes.  Abdominal: She exhibits no distension and  no mass.  Musculoskeletal: She exhibits no edema.  Lymphadenopathy:    She has no cervical adenopathy.  Neurological: She is alert and oriented to person, place, and time.  Skin: Skin is warm and dry. No rash noted. She is not diaphoretic.  Psychiatric: Memory, affect and judgment normal.    Lab Results  Component Value Date   TSH 1.95 10/21/2010   Lab Results  Component Value Date   WBC 9.7 10/21/2010   HGB 14.4 10/21/2010   HCT 41.8 10/21/2010   MCV 90.3 10/21/2010   PLT 286.0 10/21/2010  Lab Results  Component Value Date   CREATININE 0.7 10/21/2010   BUN 9 10/21/2010   NA 138 10/21/2010   K 4.2 10/21/2010   CL 105 10/21/2010   CO2 26 10/21/2010   Lab Results  Component Value Date   ALT 15 10/21/2010   AST 17 10/21/2010   ALKPHOS 72 10/21/2010   BILITOT 0.5 10/21/2010   Lab Results  Component Value Date   CHOL 255* 10/21/2010   Lab Results  Component Value Date   HDL 37.90* 10/21/2010   No results found for this basename: [facility]   Lab Results  Component Value Date   TRIG 284.0* 10/21/2010   Lab Results  Component Value Date   CHOLHDL 7 10/21/2010     Assessment & Plan  Poor concentration Likely multifactorial and to include insomnia, exhaustion, depression. Possibility of ADD does exist. We'll try small dose of Ritalin 10 mg daily and she is warned that we may need to undergo or ADHD evaluation in the future if symptoms are not corrected readily  Tobacco abuse Patient continues to smoke unfortunately roughly a third of a pack a day once again is counseled length greater than 3 minutes about the need for complete cessation expresses understanding but is noncommittal about her prospects.  Fatigue Patient struggling with worsening fatigue, insomnia and feeling overwhelmed. Will also call [place] to see if this helps and is encouraged to try and manage 7-8 hours of sleep and good sleep hygiene is discussed at length we'll give her alprazolam 0.25 mg 1-2 tabs to use at bedtime to see if  that helps her to sleep more soundly throughout the night.  Pharyngitis Minimal symptoms, rapid strep neg, increase fluids, rest and use Ibuprofen prn for pain, clal if symptoms worse

## 2011-02-10 NOTE — Assessment & Plan Note (Signed)
Likely multifactorial and to include insomnia, exhaustion, depression. Possibility of ADD does exist. We'll try small dose of Ritalin 10 mg daily and she is warned that we may need to undergo or ADHD evaluation in the future if symptoms are not corrected readily

## 2011-02-10 NOTE — Patient Instructions (Signed)

## 2011-02-10 NOTE — Assessment & Plan Note (Signed)
Patient continues to smoke unfortunately roughly a third of a pack a day once again is counseled length greater than 3 minutes about the need for complete cessation expresses understanding but is noncommittal about her prospects.

## 2011-02-10 NOTE — Assessment & Plan Note (Signed)
Minimal symptoms, rapid strep neg, increase fluids, rest and use Ibuprofen prn for pain, clal if symptoms worse

## 2011-03-09 ENCOUNTER — Ambulatory Visit (INDEPENDENT_AMBULATORY_CARE_PROVIDER_SITE_OTHER)
Admission: RE | Admit: 2011-03-09 | Discharge: 2011-03-09 | Disposition: A | Discharge status: Home / Self Care | Payer: Managed Care, Other (non HMO) | Source: Ambulatory Visit | Attending: Family Medicine | Admitting: Family Medicine

## 2011-03-09 ENCOUNTER — Ambulatory Visit (INDEPENDENT_AMBULATORY_CARE_PROVIDER_SITE_OTHER): Payer: BC Managed Care – PPO | Admitting: Family Medicine

## 2011-03-09 ENCOUNTER — Other Ambulatory Visit: Payer: Self-pay | Admitting: Family Medicine

## 2011-03-09 ENCOUNTER — Ambulatory Visit (HOSPITAL_BASED_OUTPATIENT_CLINIC_OR_DEPARTMENT_OTHER)
Admission: RE | Admit: 2011-03-09 | Discharge: 2011-03-09 | Disposition: A | Discharge status: Home / Self Care | Payer: Managed Care, Other (non HMO) | Source: Ambulatory Visit | Attending: Family Medicine | Admitting: Family Medicine

## 2011-03-09 ENCOUNTER — Encounter: Payer: Self-pay | Admitting: Family Medicine

## 2011-03-09 VITALS — BP 134/76 | HR 78 | Temp 98.3°F | Ht 63.5 in | Wt 182.0 lb

## 2011-03-09 DIAGNOSIS — R1031 Right lower quadrant pain: Secondary | ICD-10-CM

## 2011-03-09 DIAGNOSIS — R030 Elevated blood-pressure reading, without diagnosis of hypertension: Secondary | ICD-10-CM

## 2011-03-09 DIAGNOSIS — R102 Pelvic and perineal pain: Secondary | ICD-10-CM

## 2011-03-09 DIAGNOSIS — R109 Unspecified abdominal pain: Secondary | ICD-10-CM

## 2011-03-09 DIAGNOSIS — R112 Nausea with vomiting, unspecified: Secondary | ICD-10-CM

## 2011-03-09 DIAGNOSIS — K589 Irritable bowel syndrome without diarrhea: Secondary | ICD-10-CM

## 2011-03-09 DIAGNOSIS — R1030 Lower abdominal pain, unspecified: Secondary | ICD-10-CM

## 2011-03-09 LAB — RENAL FUNCTION PANEL
BUN: 11 mg/dL (ref 6–23)
Creatinine, Ser: 0.6 mg/dL (ref 0.4–1.2)
GFR: 106.27 mL/min (ref >60.00)
Sodium: 139 mEq/L (ref 135–145)

## 2011-03-09 LAB — POCT URINALYSIS DIPSTICK
Bilirubin, UA: NEGATIVE
Blood, UA: NEGATIVE
Nitrite, UA: NEGATIVE
Protein, UA: NEGATIVE
pH, UA: 6

## 2011-03-09 LAB — CBC WITH DIFFERENTIAL/PLATELET
Basophils Absolute: 0 10*3/uL (ref 0.0–0.1)
Eosinophils Absolute: 0.1 10*3/uL (ref 0.0–0.7)
HCT: 42.2 % (ref 36.0–46.0)
Hemoglobin: 14 g/dL (ref 12.0–15.0)
Lymphocytes Relative: 18.5 % (ref 12.0–46.0)
Lymphs Abs: 2.4 10*3/uL (ref 0.7–4.0)
MCHC: 33.3 g/dL (ref 30.0–36.0)
Neutro Abs: 9.8 10*3/uL — ABNORMAL HIGH (ref 1.4–7.7)
Platelets: 254 10*3/uL (ref 150.0–400.0)
RDW: 13.9 % (ref 11.5–14.6)

## 2011-03-09 MED ORDER — HYOSCYAMINE SULFATE 0.125 MG PO TABS: 0.1250 mg | ORAL_TABLET | ORAL | Status: AC | PRN

## 2011-03-09 NOTE — Assessment & Plan Note (Addendum)
Has just started this am, was sharp and led to an episode of vomitting and sweating this am. Urinalysis reveals unremarkable results, will send for cullture. For the lower pain will send for abdominal xray and ultrasound. WBC is noted to be up some, consider diverticulitis if patient has more pain and other testing does not show any source for her discomfort. She is noted to have low grade comstipation and she did move bowels normally this am. Generally moves her bowels only every couple of days, she is encouraged to add docusate 100mg  po daily and to start a fiber supplement such as Benefiber powder daily and a probiotic such as L[employer], academic daily

## 2011-03-09 NOTE — Progress Notes (Signed)
Quick Note:  Pls notify patient that her abdominal x-ray was normal and her pelvic ultrasounds were normal. Thx--PM ______

## 2011-03-09 NOTE — Progress Notes (Signed)
Hannah Garrett [id number] 12/15/1965 03/09/2011      Progress Note-Follow Up  Subjective  Chief Complaint  Chief Complaint  Patient presents with  . Emesis    X 2 days  . Abdominal Pain    tender- lower abdomen  X 2 days    HPI  Patient is a 45 yo caucasian female in today for evaluation of abdominal pain. She reports much improvement in her general state of health when the tape. By the evening she was struggling with a frontal headache and some malaise. Subcutaneous insulin we'll hold the this when he is having some severe lower abdominal pain which resulted in nausea and one episode of vomiting. She'll diaphoretic and had increased sharp pain in her lower abdomen after that. She has a very low grade nausea at this time and no persistent vomiting. She's had no bloody or tarry stool. She denies any vaginal discharge or tingling concerned. She does note some urinary urgency since being here but denies dysuria. No obvious chills or myalgias. No chest pain, palpitations, shortness of breath.  Past Medical History  Diagnosis Date  . Allergy     seasonal  . Hyperlipidemia   . Positive TB test   . Arthritis   . Asthma   . IBS (irritable bowel syndrome)   . Fibromyalgia   . Asthma, exercise induced 10/20/2010  . Depression 10/20/2010  . Right shoulder pain 10/20/2010  . Neck pain, chronic 10/20/2010  . Allergic rhinitis 10/21/2010  . Recurrent sinusitis 10/21/2010  . Irritable bowel syndrome (IBS) 10/21/2010  . BCC (basal cell carcinoma), face 10/21/2010  . Endometriosis 10/21/2010  . Urge incontinence 10/21/2010  . Plantar fasciitis 10/21/2010  . Tobacco abuse 10/21/2010  . Back pain 10/21/2010  . Headache, migraine 10/21/2010  . Headache, tension-type 10/21/2010  . 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 Overweight 10/21/2010  . Fatigue 10/21/2010  . Peripheral edema 10/21/2010  . Insomnia 02/10/2011  . Poor concentration 02/10/2011  . Pharyngitis 02/10/2011  . Suprapubic pain, acute 03/09/2011    Past Surgical History    Procedure Date  . Tonsillectomy and adenoidectomy 1974  . Right knee 1987  . Cesarean section 1988  . Dilation and curettage of uterus 1994 and 1995  . Left knee 2002  . Neck surgery 2003 & 2005  . Left shoulder 2004  . Abdominal hysterectomy 2004  . Shock wave on right foot 2009  . Left foot surgery 2010  . Nose surgery 14 and 45 yrs old  . Sinus surgeries 2005 &2007    Family History  Problem Relation Age of Onset  . Hypertension Mother   . Diabetes Mother     borderline  . Asthma Mother   . Arthritis Mother   . Hypertension Father   . Arthritis Father   . Cancer Father     prostate  . Heart disease Father     s/p MI  . Asthma Daughter   . Migraines Daughter   . GER disease Daughter   . Cancer Son     CA- calcification, right lower ribs was causing significant breathing trouble after returning from [place]  . Heart murmur Son   . Heart disease Maternal Grandmother   . Diabetes Maternal Grandfather   . Cancer Paternal Grandmother     stomach  . Asthma Daughter   . COPD Brother     smoker, lung disease    History   Social History  . Marital Status: Married    Spouse Name:  N/A    Number of Children: N/A  . Years of Education: N/A   Occupational History  . Not on file.   Social History Main Topics  . Smoking status: Current Some Day Smoker    Types: Cigarettes  . Smokeless tobacco: Never Used  . Alcohol Use: Yes     occasional  . Drug Use: No  . Sexually Active: Yes   Other Topics Concern  . Not on file   Social History Narrative  . No narrative on file    Current Outpatient Prescriptions on File Prior to Visit  Medication Sig Dispense Refill  . albuterol (VENTOLIN HFA) 108 (90 BASE) MCG/ACT inhaler Inhale 2 puffs into the lungs every 6 (six) hours as needed for wheezing.  1 Inhaler  5  . ALPRAZolam (XANAX) 0.25 MG tablet Take 1 tablet (0.25 mg total) by mouth at bedtime as needed for sleep or anxiety (1-2 tab po qhs prn insomnia).  30 tablet  1   . fluticasone (FLONASE) 50 MCG/ACT nasal spray 2 sprays by Nasal route daily.  16 g  2  . meperidine (DEMEROL) 50 MG tablet Take 50 mg by mouth every 4 (four) hours as needed.        . methylphenidate (RITALIN) 10 MG tablet Take 1 tablet (10 mg total) by mouth daily.  30 tablet  0  . Milnacipran HCl (SAVELLA) 50 MG TABS Take 1 tablet (50 mg total) by mouth 2 (two) times daily.  60 tablet  1  . promethazine (PHENERGAN) 25 MG tablet Take 25 mg by mouth every 6 (six) hours as needed.        . ranitidine (ZANTAC) 300 MG tablet Take 1 tablet (300 mg total) by mouth at bedtime.  30 tablet  2  . spironolactone (ALDACTONE) 25 MG tablet 1 tab po daily prn edema/bloating  30 tablet  3    Allergies  Allergen Reactions  . Codeine Hives  . Darvocet (Propoxyphene N-Acetaminophen) Nausea And Vomiting  . Percocet (Oxycodone-Acetaminophen) Nausea And Vomiting    Review of Systems  Review of Systems  Constitutional: Negative for fever and malaise/fatigue.  HENT: Negative for congestion.   Eyes: Negative for discharge.  Respiratory: Negative for cough and shortness of breath.   Cardiovascular: Negative for chest pain, palpitations and leg swelling.  Gastrointestinal: Positive for abdominal pain and constipation. Negative for nausea, diarrhea, blood in stool and melena.  Genitourinary: Positive for urgency. Negative for dysuria, hematuria and flank pain.  Musculoskeletal: Negative for falls.  Skin: Negative for rash.  Neurological: Positive for headaches. Negative for loss of consciousness.  Endo/Heme/Allergies: Negative for polydipsia.  Psychiatric/Behavioral: Negative for depression and suicidal ideas. The patient is not nervous/anxious and does not have insomnia.     Objective  BP 134/76  Pulse 78  Temp(Src) 98.3 F (36.8 C) (Oral)  Ht 5' 3.5" (1.613 m)  Wt 182 lb (82.555 kg)  BMI 31.73 kg/m2  SpO2 99%  Physical Exam  Physical Exam  Constitutional: She is well-developed,  well-nourished, and in no distress. No distress.  HENT:  Left Ear: External ear normal.  Mouth/Throat: No oropharyngeal exudate.  Eyes: EOM are normal. Left eye exhibits no discharge. No scleral icterus.  Neck: No JVD present.  Cardiovascular: Normal heart sounds and intact distal pulses.   Pulmonary/Chest: No respiratory distress. She has no rales.  Abdominal: She exhibits no distension and no mass. There is tenderness. There is no guarding.       Suprapubic and b/l lower  quadrant pain with palpation  Musculoskeletal: She exhibits no edema and no tenderness.  Lymphadenopathy:    She has no cervical adenopathy.  Skin: No rash noted. No erythema.    Lab Results  Component Value Date   TSH 1.95 10/21/2010   Lab Results  Component Value Date   WBC 12.7* 03/09/2011   HGB 14.0 03/09/2011   HCT 42.2 03/09/2011   MCV 90.6 03/09/2011   PLT 254.0 03/09/2011   Lab Results  Component Value Date   CREATININE 0.6 03/09/2011   BUN 11 03/09/2011   NA 139 03/09/2011   K 4.1 03/09/2011   CL 107 03/09/2011   CO2 25 03/09/2011   Lab Results  Component Value Date   ALT 15 10/21/2010   AST 17 10/21/2010   ALKPHOS 72 10/21/2010   BILITOT 0.5 10/21/2010   Lab Results  Component Value Date   CHOL 255* 10/21/2010   Lab Results  Component Value Date   HDL 37.90* 10/21/2010   No results found for this basename: LDLCALC   Lab Results  Component Value Date   TRIG 284.0* 10/21/2010   Lab Results  Component Value Date   CHOLHDL 7 10/21/2010     Assessment & Plan  Suprapubic pain, acute Has just started this am, was sharp and led to an episode of vomitting and sweating this am. Urinalysis reveals unremarkable results, will send for cullture. For the lower pain will send for abdominal xray and ultrasound. WBC is noted to be up some, consider diverticulitis if patient has more pain and other testing does not show any source for her discomfort. She is noted to have low grade comstipation and she did move bowels  normally this am. Generally moves her bowels only every couple of days, she is encouraged to add docusate 100mg  po daily and to start a fiber supplement such as Benefiber powder daily and a probiotic such as Align daily  Irritable bowel syndrome (IBS) Had a normal formed bowel movement this morning, which was normal for her and then had the suprapubic pain  Elevated BP BP elevated upon arrival secondary to pain and recent episode of vomiting. Improved on recheck

## 2011-03-09 NOTE — Patient Instructions (Signed)
Abdominal Pain Abdominal pain can be caused by many things. Your caregiver decides the seriousness of your pain by an examination and possibly blood tests and X-rays. Many cases can be observed and treated at home. Most abdominal pain is not caused by a disease and will probably improve without treatment. However, in many cases, more time must pass before a clear cause of the pain can be found. Before that point, it may not be known if you need more testing, or if hospitalization or surgery is needed. HOME CARE INSTRUCTIONS  Do not take laxatives unless directed by your caregiver.   Take pain medicine only as directed by your caregiver.   Only take over-the-counter or prescription medicines for pain, discomfort, or fever as directed by your caregiver.   Try a clear liquid diet (broth, tea, or water) for 24 hours or as ordered by your caregiver. Slowly move to a bland diet as tolerated.  SEEK IMMEDIATE MEDICAL CARE IF:  The pain does not go away.   You or your child has an oral temperature above 102, not controlled by medicine.   You keep throwing up (vomiting).   The pain is felt only in portions of the abdomen. Pain in the right side could possibly be appendicitis. In an adult, pain in the left lower portion of the abdomen could be colitis or diverticulitis.   You pass bloody or black tarry stools.  MAKE SURE YOU:  Understand these instructions.   Will watch your condition.   Will get help right away if you are not doing well or get worse.  Document Released: 03/10/2005 Document Re-Released: 08/25/2009 [facility] Patient Information 2011 [place], [place].

## 2011-03-09 NOTE — Assessment & Plan Note (Signed)
BP elevated upon arrival secondary to pain and recent episode of vomiting. Improved on recheck

## 2011-03-09 NOTE — Assessment & Plan Note (Signed)
Had a normal formed bowel movement this morning, which was normal for her and then had the suprapubic pain

## 2011-03-10 NOTE — Progress Notes (Signed)
Patient informed and going to pick up the medication, stool softner, and benefiber

## 2011-03-12 LAB — URINE CULTURE

## 2011-03-15 MED ORDER — LEVOFLOXACIN 500 MG PO TABS: 500.0000 mg | ORAL_TABLET | Freq: Every day | ORAL | Status: DC

## 2011-03-15 NOTE — Progress Notes (Signed)
Addended by: Court Joy on: 03/15/2011 01:38 PM   Modules accepted: Orders

## 2011-03-31 LAB — CBC
HCT: 40.5
MCHC: 34.3
MCV: 88.6
Platelets: 311
RDW: 12.9
WBC: 9.2

## 2011-03-31 LAB — BASIC METABOLIC PANEL
BUN: 12
Chloride: 104
Creatinine, Ser: 0.62
Glucose, Bld: 89
Potassium: 3.2 — ABNORMAL LOW

## 2011-04-16 ENCOUNTER — Ambulatory Visit (INDEPENDENT_AMBULATORY_CARE_PROVIDER_SITE_OTHER): Payer: BC Managed Care – PPO | Admitting: Family Medicine

## 2011-04-16 ENCOUNTER — Encounter: Payer: Self-pay | Admitting: Family Medicine

## 2011-04-16 DIAGNOSIS — J40 Bronchitis, not specified as acute or chronic: Secondary | ICD-10-CM

## 2011-04-16 DIAGNOSIS — H6690 Otitis media, unspecified, unspecified ear: Secondary | ICD-10-CM

## 2011-04-16 DIAGNOSIS — R030 Elevated blood-pressure reading, without diagnosis of hypertension: Secondary | ICD-10-CM

## 2011-04-16 DIAGNOSIS — Z720 Tobacco use: Secondary | ICD-10-CM

## 2011-04-16 DIAGNOSIS — J019 Acute sinusitis, unspecified: Secondary | ICD-10-CM

## 2011-04-16 DIAGNOSIS — F17200 Nicotine dependence, unspecified, uncomplicated: Secondary | ICD-10-CM

## 2011-04-16 MED ORDER — BUDESONIDE-FORMOTEROL FUMARATE 80-4.5 MCG/ACT IN AERO: 1.0000 | INHALATION_SPRAY | Freq: Two times a day (BID) | RESPIRATORY_TRACT | Status: DC

## 2011-04-16 MED ORDER — CHLORPHENIRAMINE-HYDROCODONE 8-10 MG/5ML PO LQCR: 5.0000 mL | Freq: Two times a day (BID) | ORAL | Status: DC | PRN

## 2011-04-16 MED ORDER — GUAIFENESIN ER 600 MG PO TB12: 600.0000 mg | ORAL_TABLET | Freq: Two times a day (BID) | ORAL | Status: DC

## 2011-04-16 MED ORDER — AMOXICILLIN-POT CLAVULANATE 875-125 MG PO TABS: 1.0000 | ORAL_TABLET | Freq: Two times a day (BID) | ORAL | Status: DC

## 2011-04-16 NOTE — Patient Instructions (Signed)

## 2011-04-20 ENCOUNTER — Encounter: Payer: Self-pay | Admitting: Family Medicine

## 2011-04-20 DIAGNOSIS — J40 Bronchitis, not specified as acute or chronic: Secondary | ICD-10-CM | POA: Insufficient documentation

## 2011-04-20 NOTE — Assessment & Plan Note (Signed)
Mildly elevated upon arrival but improved with recheck

## 2011-04-20 NOTE — Progress Notes (Signed)
Hannah Garrett [id number] 11-12-1965 04/20/2011      Progress Note-Follow Up  Subjective  Chief Complaint  Chief Complaint  Patient presents with  . Cough    X 2 weeks  . Otalgia    both ears hurt X 2 days    HPI  Patient is a 45 year old Caucasian female who is in today for evaluation of roughly 2 weeks' worth of worsening URI symptoms. She notes she's had some nasal congestion and chest congestion. Some sore throat, fatigue, malaise, chills, ear pain left worse than right. She noted some enlarging lymph nodes in her neck yesterday. She said some nausea but no vomiting or diarrhea. She's been coughing so severely it kept her up at night at times and even cause some mild incontinence. She has tried some Robitussin and Delsym over-the-counter which has helped marginally. She has been noting increasing albuterol use just in the last 2 days. She also notes a mild loss of appetite. No chest pain or palpitations. No significant rhinorrhea.  Past Medical History  Diagnosis Date  . Allergy     seasonal  . Hyperlipidemia   . Positive TB test   . Arthritis   . Asthma   . IBS (irritable bowel syndrome)   . Fibromyalgia   . Asthma, exercise induced 10/20/2010  . Depression 10/20/2010  . Right shoulder pain 10/20/2010  . Neck pain, chronic 10/20/2010  . Allergic rhinitis 10/21/2010  . Recurrent sinusitis 10/21/2010  . Irritable bowel syndrome (IBS) 10/21/2010  . BCC (basal cell carcinoma), face 10/21/2010  . Endometriosis 10/21/2010  . Urge incontinence 10/21/2010  . Plantar fasciitis 10/21/2010  . Tobacco abuse 10/21/2010  . Back pain 10/21/2010  . Headache, migraine 10/21/2010  . Headache, tension-type 10/21/2010  . 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 Overweight 10/21/2010  . Fatigue 10/21/2010  . Peripheral edema 10/21/2010  . Insomnia 02/10/2011  . Poor concentration 02/10/2011  . Pharyngitis 02/10/2011  . Suprapubic pain, acute 03/09/2011  . Bronchitis 04/20/2011    Past Surgical History  Procedure Date  .  Tonsillectomy and adenoidectomy 1974  . Right knee 1987  . Cesarean section 1988  . Dilation and curettage of uterus 1994 and 1995  . Left knee 2002  . Neck surgery 2003 & 2005  . Left shoulder 2004  . Abdominal hysterectomy 2004  . Shock wave on right foot 2009  . Left foot surgery 2010  . Nose surgery 14 and 45 yrs old  . Sinus surgeries 2005 &2007    Family History  Problem Relation Age of Onset  . Hypertension Mother   . Diabetes Mother     borderline  . Asthma Mother   . Arthritis Mother   . Hypertension Father   . Arthritis Father   . Cancer Father     prostate  . Heart disease Father     s/p MI  . Asthma Daughter   . Migraines Daughter   . GER disease Daughter   . Cancer Son     CA- calcification, right lower ribs was causing significant breathing trouble after returning from [place]  . Heart murmur Son   . Heart disease Maternal Grandmother   . Diabetes Maternal Grandfather   . Cancer Paternal Grandmother     stomach  . Asthma Daughter   . COPD Brother     smoker, lung disease    History   Social History  . Marital Status: Married    Spouse Name: N/A    Number of Children:  N/A  . Years of Education: N/A   Occupational History  . Not on file.   Social History Main Topics  . Smoking status: Current Some Day Smoker    Types: Cigarettes  . Smokeless tobacco: Never Used  . Alcohol Use: Yes     occasional  . Drug Use: No  . Sexually Active: Yes   Other Topics Concern  . Not on file   Social History Narrative  . No narrative on file    Current Outpatient Prescriptions on File Prior to Visit  Medication Sig Dispense Refill  . albuterol (VENTOLIN HFA) 108 (90 BASE) MCG/ACT inhaler Inhale 2 puffs into the lungs every 6 (six) hours as needed for wheezing.  1 Inhaler  5  . ALPRAZolam (XANAX) 0.25 MG tablet Take 1 tablet (0.25 mg total) by mouth at bedtime as needed for sleep or anxiety (1-2 tab po qhs prn insomnia).  30 tablet  1  . fluticasone  (FLONASE) 50 MCG/ACT nasal spray 2 sprays by Nasal route daily.  16 g  2  . levofloxacin (LEVAQUIN) 500 MG tablet Take 1 tablet (500 mg total) by mouth daily.  14 tablet  0  . meperidine (DEMEROL) 50 MG tablet Take 50 mg by mouth every 4 (four) hours as needed.        . methylphenidate (RITALIN) 10 MG tablet Take 1 tablet (10 mg total) by mouth daily.  30 tablet  0  . Milnacipran HCl (SAVELLA) 50 MG TABS Take 1 tablet (50 mg total) by mouth 2 (two) times daily.  60 tablet  1  . promethazine (PHENERGAN) 25 MG tablet Take 25 mg by mouth every 6 (six) hours as needed.        . ranitidine (ZANTAC) 300 MG tablet Take 1 tablet (300 mg total) by mouth at bedtime.  30 tablet  2  . spironolactone (ALDACTONE) 25 MG tablet 1 tab po daily prn edema/bloating  30 tablet  3    Allergies  Allergen Reactions  . Codeine Hives  . Darvocet (Propoxyphene N-Acetaminophen) Nausea And Vomiting  . Percocet (Oxycodone-Acetaminophen) Nausea And Vomiting    Review of Systems  Review of Systems  Constitutional: Positive for chills and malaise/fatigue. Negative for fever.  HENT: Positive for ear pain, congestion and sore throat.   Eyes: Negative for discharge.  Respiratory: Positive for cough, shortness of breath and wheezing. Negative for sputum production.   Cardiovascular: Negative for chest pain, palpitations and leg swelling.  Gastrointestinal: Negative for nausea, abdominal pain and diarrhea.  Genitourinary: Negative for dysuria.  Musculoskeletal: Negative for falls.  Skin: Negative for rash.  Neurological: Negative for loss of consciousness and headaches.  Endo/Heme/Allergies: Negative for polydipsia.  Psychiatric/Behavioral: Negative for depression and suicidal ideas. The patient is not nervous/anxious and does not have insomnia.     Objective  BP 112/82  Pulse 95  Temp(Src) 98 F (36.7 C) (Oral)  Ht 5' 3.5" (1.613 m)  Wt 182 lb 12.8 oz (82.918 kg)  BMI 31.87 kg/m2  SpO2 98%  Physical  Exam  Physical Exam  Constitutional: She is oriented to person, place, and time and well-developed, well-nourished, and in no distress. No distress.  HENT:  Head: Normocephalic and atraumatic.       Nasal mucosa boggy and erythematous  Eyes: Conjunctivae are normal.  Neck: Neck supple. No thyromegaly present.  Cardiovascular: Normal rate, regular rhythm and normal heart sounds.   No murmur heard. Pulmonary/Chest: Effort normal and breath sounds normal. She has no wheezes.  Abdominal: She exhibits no distension and no mass.  Musculoskeletal: She exhibits no edema.  Lymphadenopathy:    She has no cervical adenopathy.  Neurological: She is alert and oriented to person, place, and time.  Skin: Skin is warm and dry. No rash noted. She is not diaphoretic.  Psychiatric: Memory, affect and judgment normal.    Lab Results  Component Value Date   TSH 1.95 10/21/2010   Lab Results  Component Value Date   WBC 12.7* 03/09/2011   HGB 14.0 03/09/2011   HCT 42.2 03/09/2011   MCV 90.6 03/09/2011   PLT 254.0 03/09/2011   Lab Results  Component Value Date   CREATININE 0.6 03/09/2011   BUN 11 03/09/2011   NA 139 03/09/2011   K 4.1 03/09/2011   CL 107 03/09/2011   CO2 25 03/09/2011   Lab Results  Component Value Date   ALT 15 10/21/2010   AST 17 10/21/2010   ALKPHOS 72 10/21/2010   BILITOT 0.5 10/21/2010   Lab Results  Component Value Date   CHOL 255* 10/21/2010   Lab Results  Component Value Date   HDL 37.90* 10/21/2010   No results found for this basename: LDLCALC   Lab Results  Component Value Date   TRIG 284.0* 10/21/2010   Lab Results  Component Value Date   CHOLHDL 7 10/21/2010     Assessment & Plan  Elevated BP Mildly elevated upon arrival but improved with recheck  Tobacco abuse Continues to smoke is encouraged to attempt complete cessation while she is feling poorly and already smoking less  Bronchitis RX given for Augmentin, she is asked to start Mucinex bid, increase fluids and  rest. Given a sample of Symbicort 1 puff po bid and given Tussionex prn for cough

## 2011-04-20 NOTE — Assessment & Plan Note (Signed)
RX given for Augmentin, she is asked to start Mucinex bid, increase fluids and rest. Given a sample of Symbicort 1 puff po bid and given Tussionex prn for cough

## 2011-04-20 NOTE — Assessment & Plan Note (Signed)
Continues to smoke is encouraged to attempt complete cessation while she is feling poorly and already smoking less

## 2011-12-02 ENCOUNTER — Ambulatory Visit (INDEPENDENT_AMBULATORY_CARE_PROVIDER_SITE_OTHER): Payer: BC Managed Care – PPO | Admitting: Family Medicine

## 2011-12-02 ENCOUNTER — Encounter: Payer: Self-pay | Admitting: Family Medicine

## 2011-12-02 VITALS — BP 127/83 | HR 84 | Temp 97.2°F | Ht 63.5 in | Wt 183.0 lb

## 2011-12-02 DIAGNOSIS — J40 Bronchitis, not specified as acute or chronic: Secondary | ICD-10-CM

## 2011-12-02 DIAGNOSIS — T7840XA Allergy, unspecified, initial encounter: Secondary | ICD-10-CM

## 2011-12-02 DIAGNOSIS — H6690 Otitis media, unspecified, unspecified ear: Secondary | ICD-10-CM

## 2011-12-02 DIAGNOSIS — J309 Allergic rhinitis, unspecified: Secondary | ICD-10-CM

## 2011-12-02 DIAGNOSIS — J019 Acute sinusitis, unspecified: Secondary | ICD-10-CM

## 2011-12-02 DIAGNOSIS — R030 Elevated blood-pressure reading, without diagnosis of hypertension: Secondary | ICD-10-CM

## 2011-12-02 MED ORDER — AMOXICILLIN-POT CLAVULANATE 875-125 MG PO TABS: 1.0000 | ORAL_TABLET | Freq: Two times a day (BID) | ORAL | Status: AC

## 2011-12-02 MED ORDER — LORATADINE 10 MG PO TABS: 10.0000 mg | ORAL_TABLET | Freq: Two times a day (BID) | ORAL | Status: DC

## 2011-12-02 MED ORDER — GUAIFENESIN ER 600 MG PO TB12: 600.0000 mg | ORAL_TABLET | Freq: Two times a day (BID) | ORAL | Status: AC

## 2011-12-02 MED ORDER — BUDESONIDE 32 MCG/ACT NA SUSP: 1.0000 | Freq: Every day | NASAL | Status: DC

## 2011-12-02 NOTE — Assessment & Plan Note (Signed)
Well controlled today despite acute illness.

## 2011-12-02 NOTE — Patient Instructions (Addendum)

## 2011-12-02 NOTE — Assessment & Plan Note (Signed)
Encouraged Claritine bid and given an rx for Rhinocort to use daily

## 2011-12-02 NOTE — Assessment & Plan Note (Signed)
Started on antibiotics and steroids today, asked to start Mucinex bid and increase rest and fluids

## 2011-12-02 NOTE — Progress Notes (Signed)
Patient ID: Hannah Garrett, female   DOB: 05/10/1966, 46 y.o.   MRN: [id number] Hannah Garrett [id number] [birth year] 12/02/2011      Progress Note-Follow Up  Subjective  Chief Complaint  Chief Complaint  Patient presents with  . Sinusitis    X a little over a month    HPI  Patient is a 47 rolled Caucasian female who is in today struggling with a month with the symptoms. A long history of recurrent sinus infections and her symptoms continue to worsen. She's having low-grade fevers and chills. Is having malaise and myalgias. She had a significant congestion difficulty breathing and started using Afrin again. Also uses an over-the-counter decongestant every 4 hours and only temporary relief. Claritin and nasal saline to help slightly. She does a lot of facial pressure and headaches now her upper gums are starting to hurt she has a lot of congestion productive largely of clear phlegm. Her ears are painful and itchy her eyes feel itchy and burning some trouble sleeping and overall feels weak and exhausted. No chest pain, palpitations or shortness of breath. No GI or GU complaints.  Past Medical History  Diagnosis Date  . Allergy     seasonal  . Hyperlipidemia   . Positive TB test   . Arthritis   . Asthma   . IBS (irritable bowel syndrome)   . Fibromyalgia   . Asthma, exercise induced 10/20/2010  . Depression 10/20/2010  . Right shoulder pain 10/20/2010  . Neck pain, chronic 10/20/2010  . Allergic rhinitis 10/21/2010  . Recurrent sinusitis 10/21/2010  . Irritable bowel syndrome (IBS) 10/21/2010  . BCC (basal cell carcinoma), face 10/21/2010  . Endometriosis 10/21/2010  . Urge incontinence 10/21/2010  . Plantar fasciitis 10/21/2010  . Tobacco abuse 10/21/2010  . Back pain 10/21/2010  . Headache, migraine 10/21/2010  . Headache, tension-type 10/21/2010  . 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 Overweight 10/21/2010  . Fatigue 10/21/2010  . Peripheral edema 10/21/2010  . Insomnia 02/10/2011  . Poor concentration 02/10/2011  .  Pharyngitis 02/10/2011  . Suprapubic pain, acute 03/09/2011  . Bronchitis 04/20/2011    Past Surgical History  Procedure Date  . Tonsillectomy and adenoidectomy 1974  . Right knee 1987  . Cesarean section 1988  . Dilation and curettage of uterus 1994 and 1995  . Left knee 2002  . Neck surgery 2003 & 2005  . Left shoulder 2004  . Abdominal hysterectomy 2004  . Shock wave on right foot 2009  . Left foot surgery 2010  . Nose surgery 14 and 46 yrs old  . Sinus surgeries 2005 &2007    Family History  Problem Relation Age of Onset  . Hypertension Mother   . Diabetes Mother     borderline  . Asthma Mother   . Arthritis Mother   . Hypertension Father   . Arthritis Father   . Cancer Father     prostate  . Heart disease Father     s/p MI  . Asthma Daughter   . Migraines Daughter   . GER disease Daughter   . Cancer Son     CA- calcification, right lower ribs was causing significant breathing trouble after returning from [place]  . Heart murmur Son   . Heart disease Maternal Grandmother   . Diabetes Maternal Grandfather   . Cancer Paternal Grandmother     stomach  . Asthma Daughter   . COPD Brother     smoker, lung disease    History  Social History  . Marital Status: Married    Spouse Name: N/A    Number of Children: N/A  . Years of Education: N/A   Occupational History  . Not on file.   Social History Main Topics  . Smoking status: Current Some Day Smoker    Types: Cigarettes  . Smokeless tobacco: Never Used  . Alcohol Use: Yes     occasional  . Drug Use: No  . Sexually Active: Yes   Other Topics Concern  . Not on file   Social History Narrative  . No narrative on file    Current Outpatient Prescriptions on File Prior to Visit  Medication Sig Dispense Refill  . albuterol (VENTOLIN HFA) 108 (90 BASE) MCG/ACT inhaler Inhale 2 puffs into the lungs every 6 (six) hours as needed for wheezing.  1 Inhaler  5  . ALPRAZolam (XANAX) 0.25 MG tablet Take 1 tablet  (0.25 mg total) by mouth at bedtime as needed for sleep or anxiety (1-2 tab po qhs prn insomnia).  30 tablet  1  . budesonide (RHINOCORT AQUA) 32 MCG/ACT nasal spray Place 1 spray into the nose daily.  1 Bottle  3  . budesonide-formoterol (SYMBICORT) 80-4.5 MCG/ACT inhaler Inhale 1 puff into the lungs 2 (two) times daily.  1 Inhaler  12  . fluticasone (FLONASE) 50 MCG/ACT nasal spray 2 sprays by Nasal route daily.  16 g  2  . loratadine (CLARITIN) 10 MG tablet Take 1 tablet (10 mg total) by mouth 2 (two) times daily.  30 tablet  11  . methylphenidate (RITALIN) 10 MG tablet Take 1 tablet (10 mg total) by mouth daily.  30 tablet  0  . Milnacipran HCl (SAVELLA) 50 MG TABS Take 1 tablet (50 mg total) by mouth 2 (two) times daily.  60 tablet  1  . spironolactone (ALDACTONE) 25 MG tablet 1 tab po daily prn edema/bloating  30 tablet  3    Allergies  Allergen Reactions  . Codeine Hives  . Darvocet (Propoxyphene-Acetaminophen) Nausea And Vomiting  . Percocet (Oxycodone-Acetaminophen) Nausea And Vomiting    Review of Systems  Review of Systems  Constitutional: Positive for fever, chills and malaise/fatigue.  HENT: Positive for ear pain and congestion.   Eyes: Positive for pain. Negative for discharge and redness.       Eyes burn and itch  Respiratory: Positive for cough, sputum production and shortness of breath.   Cardiovascular: Negative for chest pain, palpitations and leg swelling.  Gastrointestinal: Negative for nausea, abdominal pain and diarrhea.  Genitourinary: Negative for dysuria.  Musculoskeletal: Negative for falls.  Skin: Negative for rash.  Neurological: Positive for headaches. Negative for loss of consciousness.  Endo/Heme/Allergies: Negative for polydipsia.  Psychiatric/Behavioral: Negative for depression and suicidal ideas. The patient is not nervous/anxious and does not have insomnia.     Objective  BP 127/83  Pulse 84  Temp 97.2 F (36.2 C) (Temporal)  Ht 5' 3.5"  (1.613 m)  Wt 183 lb (83.008 kg)  BMI 31.91 kg/m2  SpO2 97%  Physical Exam  Physical Exam  Constitutional: She is oriented to person, place, and time and well-developed, well-nourished, and in no distress. No distress.  HENT:  Head: Normocephalic and atraumatic.       Right TM erythematous  Eyes: Conjunctivae are normal.  Neck: Neck supple. No thyromegaly present.  Cardiovascular: Normal rate, regular rhythm and normal heart sounds.   No murmur heard. Pulmonary/Chest: Effort normal and breath sounds normal. She has no wheezes.  Abdominal: She exhibits no distension and no mass.  Musculoskeletal: She exhibits no edema.  Lymphadenopathy:    She has no cervical adenopathy.  Neurological: She is alert and oriented to person, place, and time.  Skin: Skin is warm and dry. No rash noted. She is not diaphoretic.  Psychiatric: Memory, affect and judgment normal.    Lab Results  Component Value Date   TSH 1.95 10/21/2010   Lab Results  Component Value Date   WBC 12.7* 03/09/2011   HGB 14.0 03/09/2011   HCT 42.2 03/09/2011   MCV 90.6 03/09/2011   PLT 254.0 03/09/2011   Lab Results  Component Value Date   CREATININE 0.6 03/09/2011   BUN 11 03/09/2011   NA 139 03/09/2011   K 4.1 03/09/2011   CL 107 03/09/2011   CO2 25 03/09/2011   Lab Results  Component Value Date   ALT 15 10/21/2010   AST 17 10/21/2010   ALKPHOS 72 10/21/2010   BILITOT 0.5 10/21/2010   Lab Results  Component Value Date   CHOL 255* 10/21/2010   Lab Results  Component Value Date   HDL 37.90* 10/21/2010   No results found for this basename: LDLCALC   Lab Results  Component Value Date   TRIG 284.0* 10/21/2010   Lab Results  Component Value Date   CHOLHDL 7 10/21/2010     Assessment & Plan  Bronchitis Started on antibiotics and steroids today, asked to start Mucinex bid and increase rest and fluids  Elevated BP Well controlled today despite acute illness.  Allergic rhinitis Encouraged Claritine bid and given an  rx for Rhinocort to use daily

## 2012-01-04 ENCOUNTER — Other Ambulatory Visit (INDEPENDENT_AMBULATORY_CARE_PROVIDER_SITE_OTHER): Payer: BC Managed Care – PPO

## 2012-01-04 DIAGNOSIS — Z0000 Encounter for general adult medical examination without abnormal findings: Secondary | ICD-10-CM

## 2012-01-04 LAB — RENAL FUNCTION PANEL
BUN: 13 mg/dL (ref 6–23)
CO2: 25 mEq/L (ref 19–32)
Chloride: 104 mEq/L (ref 96–112)
GFR: 100.43 mL/min (ref >60.00)
Phosphorus: 2.6 mg/dL (ref 2.3–4.6)
Potassium: 3.8 mEq/L (ref 3.5–5.1)
Sodium: 136 mEq/L (ref 135–145)

## 2012-01-04 LAB — HEPATIC FUNCTION PANEL
ALT: 14 U/L (ref 0–35)
AST: 17 U/L (ref 0–37)
Albumin: 4 g/dL (ref 3.5–5.2)
Alkaline Phosphatase: 77 U/L (ref 39–117)
Bilirubin, Direct: 0 mg/dL (ref 0.0–0.3)
Total Bilirubin: 0.5 mg/dL (ref 0.3–1.2)
Total Protein: 7.4 g/dL (ref 6.0–8.3)

## 2012-01-04 LAB — CBC
HCT: 40.7 % (ref 36.0–46.0)
Hemoglobin: 13.9 g/dL (ref 12.0–15.0)
MCHC: 34.2 g/dL (ref 30.0–36.0)
MCV: 88.5 fl (ref 78.0–100.0)
Platelets: 278 K/uL (ref 150.0–400.0)
RBC: 4.6 Mil/uL (ref 3.87–5.11)
RDW: 13.7 % (ref 11.5–14.6)
WBC: 9.6 K/uL (ref 4.5–10.5)

## 2012-01-04 LAB — LDL CHOLESTEROL, DIRECT: Direct LDL: 129.3 mg/dL

## 2012-01-04 LAB — LIPID PANEL
Cholesterol: 305 mg/dL — ABNORMAL HIGH (ref 0–200)
HDL: 38.7 mg/dL — ABNORMAL LOW (ref >39.00)
Total CHOL/HDL Ratio: 8
Triglycerides: 730 mg/dL — ABNORMAL HIGH (ref 0.0–149.0)
VLDL: 146 mg/dL — ABNORMAL HIGH (ref 0.0–40.0)

## 2012-01-04 LAB — TSH: TSH: 2.08 u[IU]/mL (ref 0.35–5.50)

## 2012-01-04 LAB — HEMOGLOBIN A1C: Hgb A1c MFr Bld: 5.7 % (ref 4.6–6.5)

## 2012-01-05 MED ORDER — ATORVASTATIN CALCIUM 10 MG PO TABS: 10.0000 mg | ORAL_TABLET | Freq: Every day | ORAL | Status: DC

## 2012-01-06 ENCOUNTER — Other Ambulatory Visit: Payer: BC Managed Care – PPO

## 2012-01-12 ENCOUNTER — Ambulatory Visit (INDEPENDENT_AMBULATORY_CARE_PROVIDER_SITE_OTHER): Payer: BC Managed Care – PPO | Admitting: Family Medicine

## 2012-01-12 ENCOUNTER — Ambulatory Visit: Payer: BC Managed Care – PPO | Admitting: Family Medicine

## 2012-01-12 ENCOUNTER — Encounter: Payer: Self-pay | Admitting: Family Medicine

## 2012-01-12 VITALS — BP 130/83 | HR 88 | Temp 97.8°F | Ht 63.5 in | Wt 184.0 lb

## 2012-01-12 DIAGNOSIS — IMO0001 Reserved for inherently not codable concepts without codable children: Secondary | ICD-10-CM

## 2012-01-12 DIAGNOSIS — E785 Hyperlipidemia, unspecified: Secondary | ICD-10-CM

## 2012-01-12 DIAGNOSIS — E663 Overweight: Secondary | ICD-10-CM

## 2012-01-12 DIAGNOSIS — R030 Elevated blood-pressure reading, without diagnosis of hypertension: Secondary | ICD-10-CM

## 2012-01-12 NOTE — Patient Instructions (Addendum)
Hypercholesterolemia High Blood Cholesterol Cholesterol is a white, waxy, fat-like protein needed by your body in small amounts. The liver makes all the cholesterol you need. It is carried from the liver by the blood through the blood vessels. Deposits (plaque) may build up on blood vessel walls. This makes the arteries narrower and stiffer. Plaque increases the risk for heart attack and stroke. You cannot feel your cholesterol level even if it is very high. The only way to know is by a blood test to check your lipid (fats) levels. Once you know your cholesterol levels, you should keep a record of the test results. Work with your caregiver to to keep your levels in the desired range. WHAT THE RESULTS MEAN:  Total cholesterol is a rough measure of all the cholesterol in your blood.   LDL is the so-called bad cholesterol. This is the type that deposits cholesterol in the walls of the arteries. You want this level to be low.   HDL is the good cholesterol because it cleans the arteries and carries the LDL away. You want this level to be high.   Triglycerides are fat that the body can either burn for energy or store. High levels are closely linked to heart disease.  DESIRED LEVELS:  Total cholesterol below 200.   LDL below 100 for people at risk, below 70 for very high risk.   HDL above 50 is good, above 60 is best.   Triglycerides below 150.  HOW TO LOWER YOUR CHOLESTEROL:  Diet.   Choose fish or white meat chicken and [place], roasted or baked. Limit fatty cuts of red meat, fried foods, and processed meats, such as sausage and lunch meat.   Eat lots of fresh fruits and vegetables. Choose whole grains, beans, pasta, potatoes and cereals.   Use only small amounts of olive, corn or canola oils. Avoid butter, mayonnaise, shortening or palm kernel oils. Avoid foods with trans-fats.   Use skim/nonfat milk and low-fat/nonfat yogurt and cheeses. Avoid whole milk, cream, ice cream, egg yolks and  cheeses. Healthy desserts include angel food cake, gingersnaps, animal crackers, hard candy, popsicles, and low-fat/nonfat frozen yogurt. Avoid pastries, cakes, pies and cookies.   Exercise.   A regular program helps decrease LDL and raises HDL.   Helps with weight control.   Do things that increase your activity level like gardening, walking, or taking the stairs.   Medication.   May be prescribed by your caregiver to help lowering cholesterol and the risk for heart disease.   You may need medicine even if your levels are normal if you have several risk factors.  HOME CARE INSTRUCTIONS   Follow your diet and exercise programs as suggested by your caregiver.   Take medications as directed.   Have blood work done when your caregiver feels it is necessary.  MAKE SURE YOU:   Understand these instructions.   Will watch your condition.   Will get help right away if you are not doing well or get worse.  Document Released: 05/31/2005 Document Revised: 05/20/2011 Document Reviewed: 11/16/2006 [facility] Patient Information 2012 [place], [place]. Start MegaRed caps daily by [employer] and a probiotic

## 2012-01-13 ENCOUNTER — Ambulatory Visit: Payer: BC Managed Care – PPO | Admitting: Family Medicine

## 2012-01-13 NOTE — Assessment & Plan Note (Signed)
Good control today no changes

## 2012-01-13 NOTE — Assessment & Plan Note (Signed)
Encouraged DASH diet and increased exercise 

## 2012-01-13 NOTE — Assessment & Plan Note (Signed)
Notably worsened, patient started on Lipitor 10 mg tab last week, tolerating this, advised to start Co Q 10 as well. Avoid trans fats, minimize saturated fats and simple carbs, referred for nutrition consult and encouraged to increase exercise

## 2012-01-13 NOTE — Progress Notes (Signed)
Patient ID: Hannah Garrett, female   DOB: [birth year], 46 y.o.   MRN: [id number] ALYXIS GRIPPI [id number] 1965-11-30 01/13/2012      Progress Note-Follow Up  Subjective  Chief Complaint  Chief Complaint  Patient presents with  . Follow-up    2 month    HPI  Patient is a 46 rolled Caucasian female who is in today to discuss hyperlipidemia. Her recent blood work showed a significant increase in her total cholesterol and her triglycerides. She was started on Lipitor last week and is tolerating that at this point. Denies myalgias. Does note some low-grade headaches occurred this past week or but no associated symptoms. She denies any chest pain, palpitations, shortness of breath, GI or GU complaints. She has been trying to make some dietary changes but unfortunately not finding time to exercise.  Past Medical History  Diagnosis Date  . Allergy     seasonal  . Hyperlipidemia   . Positive TB test   . Arthritis   . Asthma   . IBS (irritable bowel syndrome)   . Fibromyalgia   . Asthma, exercise induced 10/20/2010  . Depression 10/20/2010  . Right shoulder pain 10/20/2010  . Neck pain, chronic 10/20/2010  . Allergic rhinitis 10/21/2010  . Recurrent sinusitis 10/21/2010  . Irritable bowel syndrome (IBS) 10/21/2010  . BCC (basal cell carcinoma), face 10/21/2010  . Endometriosis 10/21/2010  . Urge incontinence 10/21/2010  . Plantar fasciitis 10/21/2010  . Tobacco abuse 10/21/2010  . Back pain 10/21/2010  . Headache, migraine 10/21/2010  . Headache, tension-type 10/21/2010  . 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 Overweight 10/21/2010  . Fatigue 10/21/2010  . Peripheral edema 10/21/2010  . Insomnia 02/10/2011  . Poor concentration 02/10/2011  . Pharyngitis 02/10/2011  . Suprapubic pain, acute 03/09/2011  . Bronchitis 04/20/2011    Past Surgical History  Procedure Date  . Tonsillectomy and adenoidectomy 1974  . Right knee 1987  . Cesarean section 1988  . Dilation and curettage of uterus 1994 and 1995  . Left knee 2002  . Neck  surgery 2003 & 2005  . Left shoulder 2004  . Abdominal hysterectomy 2004  . Shock wave on right foot 2009  . Left foot surgery 2010  . Nose surgery 14 and 46 yrs old  . Sinus surgeries 2005 &2007    Family History  Problem Relation Age of Onset  . Hypertension Mother   . Diabetes Mother     borderline  . Asthma Mother   . Arthritis Mother   . Hypertension Father   . Arthritis Father   . Cancer Father     prostate  . Heart disease Father     s/p MI  . Asthma Daughter   . Migraines Daughter   . GER disease Daughter   . Cancer Son     CA- calcification, right lower ribs was causing significant breathing trouble after returning from [place]  . Heart murmur Son   . Heart disease Maternal Grandmother   . Diabetes Maternal Grandfather   . Cancer Paternal Grandmother     stomach  . Asthma Daughter   . COPD Brother     smoker, lung disease    History   Social History  . Marital Status: Married    Spouse Name: N/A    Number of Children: N/A  . Years of Education: N/A   Occupational History  . Not on file.   Social History Main Topics  . Smoking status: Current Some Day Smoker  Types: Cigarettes  . Smokeless tobacco: Never Used  . Alcohol Use: Yes     occasional  . Drug Use: No  . Sexually Active: Yes   Other Topics Concern  . Not on file   Social History Narrative  . No narrative on file    Current Outpatient Prescriptions on File Prior to Visit  Medication Sig Dispense Refill  . albuterol (VENTOLIN HFA) 108 (90 BASE) MCG/ACT inhaler Inhale 2 puffs into the lungs every 6 (six) hours as needed for wheezing.  1 Inhaler  5  . ALPRAZolam (XANAX) 0.25 MG tablet Take 1 tablet (0.25 mg total) by mouth at bedtime as needed for sleep or anxiety (1-2 tab po qhs prn insomnia).  30 tablet  1  . atorvastatin (LIPITOR) 10 MG tablet Take 1 tablet (10 mg total) by mouth daily.  30 tablet  1  . budesonide (RHINOCORT AQUA) 32 MCG/ACT nasal spray Place 1 spray into the nose  daily.  1 Bottle  3  . budesonide-formoterol (SYMBICORT) 80-4.5 MCG/ACT inhaler Inhale 1 puff into the lungs 2 (two) times daily.  1 Inhaler  12  . loratadine (CLARITIN) 10 MG tablet Take 1 tablet (10 mg total) by mouth 2 (two) times daily.  30 tablet  11  . methylphenidate (RITALIN) 10 MG tablet Take 1 tablet (10 mg total) by mouth daily.  30 tablet  0  . Milnacipran HCl (SAVELLA) 50 MG TABS Take 1 tablet (50 mg total) by mouth 2 (two) times daily.  60 tablet  1  . spironolactone (ALDACTONE) 25 MG tablet 1 tab po daily prn edema/bloating  30 tablet  3    Allergies  Allergen Reactions  . Codeine Hives  . Darvocet (Propoxyphene-Acetaminophen) Nausea And Vomiting  . Percocet (Oxycodone-Acetaminophen) Nausea And Vomiting    Review of Systems  Review of Systems  Constitutional: Negative for fever and malaise/fatigue.  HENT: Negative for congestion.   Eyes: Negative for discharge.  Respiratory: Negative for shortness of breath.   Cardiovascular: Negative for chest pain, palpitations and leg swelling.  Gastrointestinal: Negative for nausea, abdominal pain and diarrhea.  Genitourinary: Negative for dysuria.  Musculoskeletal: Negative for falls.  Skin: Negative for rash.  Neurological: Negative for loss of consciousness and headaches.  Endo/Heme/Allergies: Negative for polydipsia.  Psychiatric/Behavioral: Negative for depression and suicidal ideas. The patient is not nervous/anxious and does not have insomnia.     Objective  BP 130/83  Pulse 88  Temp 97.8 F (36.6 C) (Temporal)  Ht 5' 3.5" (1.613 m)  Wt 184 lb (83.462 kg)  BMI 32.08 kg/m2  SpO2 97%  Physical Exam  Physical Exam  Constitutional: She is oriented to person, place, and time and well-developed, well-nourished, and in no distress. No distress.  HENT:  Head: Normocephalic and atraumatic.  Eyes: Conjunctivae are normal.  Neck: Neck supple. No thyromegaly present.  Cardiovascular: Normal rate, regular rhythm and  normal heart sounds.   No murmur heard. Pulmonary/Chest: Effort normal and breath sounds normal. She has no wheezes.  Abdominal: She exhibits no distension and no mass.  Musculoskeletal: She exhibits no edema.  Lymphadenopathy:    She has no cervical adenopathy.  Neurological: She is alert and oriented to person, place, and time.  Skin: Skin is warm and dry. No rash noted. She is not diaphoretic.  Psychiatric: Memory, affect and judgment normal.    Lab Results  Component Value Date   TSH 2.08 01/04/2012   Lab Results  Component Value Date   WBC 9.6  01/04/2012   HGB 13.9 01/04/2012   HCT 40.7 01/04/2012   MCV 88.5 01/04/2012   PLT 278.0 01/04/2012   Lab Results  Component Value Date   CREATININE 0.7 01/04/2012   BUN 13 01/04/2012   NA 136 01/04/2012   K 3.8 01/04/2012   CL 104 01/04/2012   CO2 25 01/04/2012   Lab Results  Component Value Date   ALT 14 01/04/2012   AST 17 01/04/2012   ALKPHOS 77 01/04/2012   BILITOT 0.5 01/04/2012   Lab Results  Component Value Date   CHOL 305* 01/04/2012   Lab Results  Component Value Date   HDL 38.70* 01/04/2012   No results found for this basename: [facility]   Lab Results  Component Value Date   TRIG 730.0* 01/04/2012   Lab Results  Component Value Date   CHOLHDL 8 01/04/2012     Assessment & Plan  Hyperlipidemia Notably worsened, patient started on Lipitor 10 mg tab last week, tolerating this, advised to start Co Q 10 as well. Avoid trans fats, minimize saturated fats and simple carbs, referred for nutrition consult and encouraged to increase exercise  Elevated BP Good control today no changes  Overweight Encouraged DASH diet and increased exercise

## 2012-02-07 ENCOUNTER — Other Ambulatory Visit: Payer: BC Managed Care – PPO

## 2012-02-11 ENCOUNTER — Other Ambulatory Visit: Payer: BC Managed Care – PPO

## 2012-02-16 ENCOUNTER — Other Ambulatory Visit: Payer: Self-pay | Admitting: Family Medicine

## 2012-02-16 ENCOUNTER — Other Ambulatory Visit: Payer: BC Managed Care – PPO

## 2012-02-16 DIAGNOSIS — E785 Hyperlipidemia, unspecified: Secondary | ICD-10-CM

## 2012-02-17 LAB — HEPATIC FUNCTION PANEL
ALT: 11 U/L (ref 0–35)
Bilirubin, Direct: <0.1 mg/dL (ref 0.0–0.3)
Total Protein: 7 g/dL (ref 6.0–8.3)

## 2012-02-17 LAB — PHOSPHORUS: Phosphorus: 2.6 mg/dL (ref 2.3–4.6)

## 2012-02-17 LAB — BASIC METABOLIC PANEL
BUN: 10 mg/dL (ref 6–23)
Calcium: 9.3 mg/dL (ref 8.4–10.5)
Chloride: 105 mEq/L (ref 96–112)
Creat: 0.85 mg/dL (ref 0.50–1.10)

## 2012-02-17 NOTE — Progress Notes (Signed)
Quick Note:  Patient Informed and voiced understanding ______ 

## 2012-05-09 ENCOUNTER — Ambulatory Visit (HOSPITAL_COMMUNITY)
Admission: RE | Admit: 2012-05-09 | Discharge: 2012-05-09 | Disposition: A | Discharge status: Home / Self Care | Payer: BC Managed Care – PPO | Source: Ambulatory Visit | Attending: Orthopedic Surgery | Admitting: Orthopedic Surgery

## 2012-05-09 ENCOUNTER — Other Ambulatory Visit (HOSPITAL_COMMUNITY): Payer: Self-pay | Admitting: Orthopedic Surgery

## 2012-05-09 DIAGNOSIS — M79606 Pain in leg, unspecified: Secondary | ICD-10-CM

## 2012-05-09 DIAGNOSIS — M79609 Pain in unspecified limb: Secondary | ICD-10-CM

## 2012-05-09 DIAGNOSIS — E663 Overweight: Secondary | ICD-10-CM | POA: Insufficient documentation

## 2012-05-09 DIAGNOSIS — M7989 Other specified soft tissue disorders: Secondary | ICD-10-CM

## 2012-05-09 NOTE — Progress Notes (Signed)
Left:  No evidence of DVT, superficial thrombosis, or Baker's cyst.  Right:  Negative for DVT in the common femoral vein.  

## 2012-06-05 ENCOUNTER — Ambulatory Visit: Payer: BC Managed Care – PPO | Admitting: Family Medicine

## 2012-07-03 ENCOUNTER — Encounter: Payer: Self-pay | Admitting: Family Medicine

## 2012-07-03 ENCOUNTER — Ambulatory Visit (INDEPENDENT_AMBULATORY_CARE_PROVIDER_SITE_OTHER): Payer: BC Managed Care – PPO | Admitting: Family Medicine

## 2012-07-03 VITALS — BP 136/93 | HR 90 | Temp 98.7°F | Ht 63.5 in | Wt 187.0 lb

## 2012-07-03 DIAGNOSIS — Z139 Encounter for screening, unspecified: Secondary | ICD-10-CM

## 2012-07-03 DIAGNOSIS — J329 Chronic sinusitis, unspecified: Secondary | ICD-10-CM

## 2012-07-03 DIAGNOSIS — Z0000 Encounter for general adult medical examination without abnormal findings: Secondary | ICD-10-CM

## 2012-07-03 LAB — POCT URINALYSIS DIPSTICK
Bilirubin, UA: NEGATIVE
Leukocytes, UA: NEGATIVE
Nitrite, UA: NEGATIVE
Protein, UA: NEGATIVE
pH, UA: 5.5

## 2012-07-03 MED ORDER — AMOXICILLIN-POT CLAVULANATE 875-125 MG PO TABS: 1.0000 | ORAL_TABLET | Freq: Two times a day (BID) | ORAL | Status: DC

## 2012-07-03 NOTE — Assessment & Plan Note (Signed)
Augmentin 875mg  bid x 10d. Mucinex plain.  Saline nasal irrigation.

## 2012-07-03 NOTE — Progress Notes (Signed)
Office Note 07/03/2012  CC:  Chief Complaint  Patient presents with  . DOT physical    HPI:  Hannah Garrett is a 47 y.o. White female who is here for DOT physical. 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  Recent sinusitis illness: pt describes 2 wks of thick nasal mucous, frontal/periorbital headache diffusely. PND, minimal cough.  No fever, no sT.  Upper teeth region with pain diffusely lately.  +Hx of recurrent sinusitis, has had sinus surgery x 2 in the past.   Past Medical History  Diagnosis Date  . Allergy     seasonal  . Hyperlipidemia   . Positive TB test   . Arthritis   . Asthma   . IBS (irritable bowel syndrome)   . Fibromyalgia   . Asthma, exercise induced 10/20/2010  . Depression 10/20/2010  . Right shoulder pain 10/20/2010  . Neck pain, chronic 10/20/2010  . Allergic rhinitis 10/21/2010  . Recurrent sinusitis 10/21/2010  . Irritable bowel syndrome (IBS) 10/21/2010  . BCC (basal cell carcinoma), face 10/21/2010  . Endometriosis 10/21/2010  . Urge incontinence 10/21/2010  . Plantar fasciitis 10/21/2010  . Tobacco abuse 10/21/2010  . Back pain 10/21/2010  . Headache, migraine 10/21/2010  . Headache, tension-type 10/21/2010  . 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 Overweight 10/21/2010  . Fatigue 10/21/2010  . Peripheral edema 10/21/2010  . Insomnia 02/10/2011  . Poor concentration 02/10/2011  . Pharyngitis 02/10/2011  . Suprapubic pain, acute 03/09/2011  . Bronchitis 04/20/2011    Past Surgical History  Procedure Date  . Tonsillectomy and adenoidectomy 1974  . Right knee 1987  . Cesarean section 1988  . Dilation and curettage of uterus 1994 and 1995  . Left knee 2002  . Neck surgery 2003 & 2005  . Left shoulder 2004  . Abdominal hysterectomy 2004  . Shock wave on right foot 2009  . Left foot surgery 2010  . Nose surgery 14 and 47 yrs old  . Sinus surgeries 2005 &2007    Family History  Problem Relation Age of Onset  . Hypertension Mother   . Diabetes Mother     borderline    . Asthma Mother   . Arthritis Mother   . Hypertension Father   . Arthritis Father   . Cancer Father     prostate  . Heart disease Father     s/p MI  . Asthma Daughter   . Migraines Daughter   . GER disease Daughter   . Cancer Son     CA- calcification, right lower ribs was causing significant breathing trouble after returning from [place]  . Heart murmur Son   . Heart disease Maternal Grandmother   . Diabetes Maternal Grandfather   . Cancer Paternal Grandmother     stomach  . Asthma Daughter   . COPD Brother     smoker, lung disease    History   Social History  . Marital Status: Married    Spouse Name: N/A    Number of Children: N/A  . Years of Education: N/A   Occupational History  . Not on file.   Social History Main Topics  . Smoking status: Current Some Day Smoker    Types: Cigarettes  . Smokeless tobacco: Never Used  . Alcohol Use: Yes     Comment: occasional  . Drug Use: No  . Sexually Active: Yes   Other Topics Concern  . Not on file   Social History Narrative  . No narrative on file  Outpatient Prescriptions Prior to Visit  Medication Sig Dispense Refill  . albuterol (VENTOLIN HFA) 108 (90 BASE) MCG/ACT inhaler Inhale 2 puffs into the lungs every 6 (six) hours as needed for wheezing.  1 Inhaler  5  . budesonide (RHINOCORT AQUA) 32 MCG/ACT nasal spray Place 1 spray into the nose daily.  1 Bottle  3  . loratadine (CLARITIN) 10 MG tablet Take 1 tablet (10 mg total) by mouth 2 (two) times daily.  30 tablet  11  . methylphenidate (RITALIN) 10 MG tablet Take 1 tablet (10 mg total) by mouth daily.  30 tablet  0  . budesonide-formoterol (SYMBICORT) 80-4.5 MCG/ACT inhaler Inhale 1 puff into the lungs 2 (two) times daily.  1 Inhaler  12  . Milnacipran HCl (SAVELLA) 50 MG TABS Take 1 tablet (50 mg total) by mouth 2 (two) times daily.  60 tablet  1  . spironolactone (ALDACTONE) 25 MG tablet 1 tab po daily prn edema/bloating  30 tablet  3  . [DISCONTINUED]  atorvastatin (LIPITOR) 10 MG tablet Take 1 tablet (10 mg total) by mouth daily.  30 tablet  1  Last reviewed on 07/03/2012 10:48 AM by Jeoffrey Massed, MD  Allergies  Allergen Reactions  . Codeine Hives  . Darvocet (Propoxyphene-Acetaminophen) Nausea And Vomiting  . Percocet (Oxycodone-Acetaminophen) Nausea And Vomiting    ROS Review of Systems  Constitutional: Positive for fatigue. Negative for fever, chills and appetite change.  HENT: Negative for ear pain, congestion, sore throat, neck stiffness and dental problem.   Eyes: Negative for discharge, redness and visual disturbance.  Respiratory: Negative for cough, chest tightness, shortness of breath and wheezing.   Cardiovascular: Negative for chest pain, palpitations and leg swelling.  Gastrointestinal: Negative for nausea, vomiting, abdominal pain, diarrhea and blood in stool.  Genitourinary: Positive for frequency (pt states this is chronic/unchanged). Negative for dysuria, urgency, hematuria, flank pain, vaginal bleeding, vaginal discharge and difficulty urinating.  Musculoskeletal: Positive for myalgias and arthralgias. Negative for back pain and joint swelling.  Skin: Negative for pallor and rash.  Neurological: Negative for dizziness, speech difficulty, weakness and headaches.  Hematological: Negative for adenopathy. Does not bruise/bleed easily.  Psychiatric/Behavioral: Negative for confusion and sleep disturbance. The patient is not nervous/anxious.     PE; Blood pressure 136/93, pulse 90, temperature 98.7 F (37.1 C), temperature source Temporal, height 5' 3.5" (1.613 m), weight 187 lb (84.823 kg), SpO2 97.00%.  BP recheck in right arm with manual bp cuff after pt in exam room 15 min was 126/86 Gen: Alert, well appearing.  Patient is oriented to person, place, time, and situation. AFFECT: pleasant, lucid thought and speech. ENT: Ears: EACs clear, normal epithelium.  TMs with good light reflex and landmarks bilaterally.   Eyes: no injection, icteris, swelling, or exudate.  EOMI, PERRLA. Nose: no drainage or turbinate edema/swelling.  No injection or focal lesion.  Mouth: lips without lesion/swelling.  Oral mucosa pink and moist.  Dentition intact and without obvious caries or gingival swelling.  Oropharynx without erythema, exudate, or swelling.  Neck: supple/nontender.  No LAD, mass, or TM.  Carotid pulses 2+ bilaterally, without bruits. CV: RRR, no m/r/g.   LUNGS: CTA bilat, nonlabored resps, good aeration in all lung fields. ABD: soft, NT, ND, BS normal.  No hepatospenomegaly or mass.  No bruits. EXT: no clubbing, cyanosis, or edema.  Musculoskeletal: no joint swelling, erythema, warmth, or tenderness.  ROM of all joints intact. Skin - no sores or suspicious lesions or rashes or color changes  Visual Acuity Screening   Right eye Left eye Both eyes  Without correction: 20/20 20/20 20/20   With correction:        Pertinent labs:  CC UA today normal except trace intact blood.  ASSESSMENT AND PLAN:   Recurrent sinusitis Augmentin 875mg  bid x 10d. Mucinex plain.  Saline nasal irrigation.  Health maintenance examination Reviewed age and gender appropriate health maintenance issues (prudent diet, regular exercise, health risks of tobacco and excessive alcohol, use of seatbelts, fire alarms in home, use of sunscreen).  Also reviewed age and gender appropriate health screening as well as vaccine recommendations. 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  An After Visit Summary was printed and given to the patient.  FOLLOW UP:  Return if symptoms worsen or fail to improve.

## 2012-07-03 NOTE — Assessment & Plan Note (Signed)
Reviewed age and gender appropriate health maintenance issues (prudent diet, regular exercise, health risks of tobacco and excessive alcohol, use of seatbelts, fire alarms in home, use of sunscreen).  Also reviewed age and gender appropriate health screening as well as vaccine recommendations. 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

## 2012-08-07 ENCOUNTER — Encounter: Payer: Self-pay | Admitting: Family Medicine

## 2012-08-07 ENCOUNTER — Ambulatory Visit (INDEPENDENT_AMBULATORY_CARE_PROVIDER_SITE_OTHER): Payer: BC Managed Care – PPO | Admitting: Family Medicine

## 2012-08-07 VITALS — BP 130/86 | HR 97 | Temp 99.1°F | Ht 63.5 in | Wt 186.0 lb

## 2012-08-07 DIAGNOSIS — G629 Polyneuropathy, unspecified: Secondary | ICD-10-CM

## 2012-08-07 DIAGNOSIS — G8929 Other chronic pain: Secondary | ICD-10-CM

## 2012-08-07 DIAGNOSIS — R5383 Other fatigue: Secondary | ICD-10-CM

## 2012-08-07 DIAGNOSIS — F909 Attention-deficit hyperactivity disorder, unspecified type: Secondary | ICD-10-CM | POA: Insufficient documentation

## 2012-08-07 MED ORDER — METHYLPHENIDATE HCL 10 MG PO TABS: ORAL_TABLET | ORAL | Status: DC

## 2012-08-07 MED ORDER — GABAPENTIN 300 MG PO CAPS: 300.0000 mg | ORAL_CAPSULE | Freq: Three times a day (TID) | ORAL | Status: DC

## 2012-08-07 MED ORDER — VARENICLINE TARTRATE 1 MG PO TABS: 1.0000 mg | ORAL_TABLET | Freq: Two times a day (BID) | ORAL | Status: DC

## 2012-08-07 MED ORDER — VARENICLINE TARTRATE 0.5 MG X 11 & 1 MG X 42 PO MISC: ORAL | Status: DC

## 2012-08-07 NOTE — Progress Notes (Signed)
OFFICE NOTE  08/08/2012  CC:  Chief Complaint  Patient presents with  . Wrist Pain    right wrist     HPI: Patient is a 47 y.o. Caucasian female who is here for right wrist pain.   Onset last 24h or so but admits it has come and gone in the past, just less severe.  Grip strength impaired yesterday, somewhat better today.  There was a focal nodular-like swelling at ulnar region of volar surface of wrist yesterday but it is gone today.  Ibuprofen helped a little bit. Feels like entire arm from elbow down has various pains, paresthesias--not specific to any one particular peripheral nerve distribution.  No similar sx's above the elbow.  No trigger of sx's with neck movements. She does type a lot.  Says past [place] studies have been negative for CTS.   Seems to have had problems with these sx's on and off since getting neck surgery, but since neck surgery she has had her right shoulder operated on.  She has chronic muscle pain basically in neck, upper back, sometimes in tops of shoulders, sometimes in hips and thighs.  Amada Jupiter is on her med list but she admits she is not taking this, doesn't even know why--can't recall getting this med rx'd.    Has poor concentration and was started on ritalin 10mg  qd and this helped a bit but only for 4 hours, then she felt easily distracted and "useless" once again.    She had success quitting smoking with chantix and had no side effects but then resumed smoking again so she asks for another trial of chantix today.  Last time I saw her she had trace hb on dipstick UA.  Denies dysuria, urgency, urinary frequency, suprapubic pain, flank pain, or gross hematuria.  No hx of microhematuria in the past.    Pertinent PMH:  Asthma ADD Fibromyalgia Depression IBS Allergic rhinitis Recurrent sinusitis Insomnia Migraine HA's  Past Surgical History  Procedure Laterality Date  . Tonsillectomy and adenoidectomy  1974  . Right knee  1987  . Cesarean section   1988  . Dilation and curettage of uterus  1994 and 1995  . Left knee  2002  . Neck surgery  2003 & 2005  . Left shoulder  2004  . Abdominal hysterectomy  2004  . Shock wave on right foot  2009  . Left foot surgery  2010  . Nose surgery  14 and 47 yrs old  . Sinus surgeries  2005 &2007  Right shoulder rotator cuff repair.  MEDS:  Outpatient Prescriptions Prior to Visit  Medication Sig Dispense Refill  . albuterol (VENTOLIN HFA) 108 (90 BASE) MCG/ACT inhaler Inhale 2 puffs into the lungs every 6 (six) hours as needed for wheezing.  1 Inhaler  5  . budesonide (RHINOCORT AQUA) 32 MCG/ACT nasal spray Place 1 spray into the nose daily.  1 Bottle  3  . budesonide-formoterol (SYMBICORT) 80-4.5 MCG/ACT inhaler Inhale 1 puff into the lungs 2 (two) times daily.  1 Inhaler  12  . loratadine (CLARITIN) 10 MG tablet Take 1 tablet (10 mg total) by mouth 2 (two) times daily.  30 tablet  11  . methylphenidate (RITALIN) 10 MG tablet Take 1 tablet (10 mg total) by mouth daily.  30 tablet  0  . Milnacipran HCl (SAVELLA) 50 MG TABS Take 1 tablet (50 mg total) by mouth 2 (two) times daily.  60 tablet  1  . amoxicillin-clavulanate (AUGMENTIN) 875-125 MG per tablet Take  1 tablet by mouth 2 (two) times daily.  20 tablet  0  . spironolactone (ALDACTONE) 25 MG tablet 1 tab po daily prn edema/bloating  30 tablet  3   No facility-administered medications prior to visit.    PE: Blood pressure 130/86, pulse 97, temperature 99.1 F (37.3 C), temperature source Temporal, height 5' 3.5" (1.613 m), weight 186 lb (84.369 kg).  Wt Readings from Last 2 Encounters:  08/07/12 186 lb (84.369 kg)  07/03/12 187 lb (84.823 kg)  Gen: alert, oriented x 4, affect pleasant.  Lucid thinking and conversation noted. HEENT: PERRLA, EOMI.   Neck: no LAD, mass, or thyromegaly. CV: RRR, no m/r/g LUNGS: CTA bilat, nonlabored. NEURO: no tremor or tics noted on observation.  Coordination intact. CN 2-12 grossly intact bilaterally.No  ataxia. Right forearm with discomfort to palpation in wrist area, extending proximally to mid forearm mostly on volar surface.  No bony tenderness.  No swelling or erythema.  Radial pulses 2+ bilat. Her grip is 5-/5 on right, 5/5 on left.  She has some limitation of right wrist ROM in all motions, mostly due to pain per pt.  I feel no nodule anywhere on her wrist.   Percussion over the ulnar nerve at the elbow does elicit tingling in distribution of ulnar nerve down to wrist.   Phalen's testing hurts with wrist flexion but really doesn't elicit any tingling or pain in numbness in median nerve distribution.   Neck: paraspinous soft tissues diffusely TTP.  No midline TTP.   Spurling's test is negative.   IMPRESSION AND PLAN:  Wrist pain, chronic She has some component of wrist sprain, possible arthritis. Also has some sx's consistent with CTS but exam findings not confirmatory of this dx. I fit her in a wrist splint today to wear hs and for comfort during the day.  For neuropathic pain will start neurontin 300mg  qhs, titrate up by 300mg  per week until she is at 300mg  tid.   I encouraged her to get back in to see Dr. Newell Coral, as he would be the best clinician to further evaluate her current right arm neuro sx's.  Adult ADHD Increase ritalin to 10mg  bid instead of just qd.   Microhematuria Will recheck UA with reflex microscopy today.  If neg, will assume previous dipstick UA was falsely positive. If positive, will further evaluate urinary tract for source of bleeding.    Fatigue With diffuse myalgias, syndrome certainly consistent with fibromyalgia. Unfortunately we didn't discuss this much today at all, but she did say she has tried cymbalta "and all the others" for this and has had no success. She was started on savella at one point in the past but appears to not be aware of this and is not currently taking this. I think this is a reasonable med to try but will hold off at this time  since she has so much else we are working on presently.  Tobacco dependence Success with chantix in the past. Will rx chantix again today.   An After Visit Summary was printed and given to the patient.  Spent 40 min with pt today, with >50% of this time spent in counseling and care coordination regarding the above problems.   FOLLOW UP: 1-2 mo.  She did voice financial concerns, so I'm not sure how much of our plan will get accomplished over the next 1mo, but we'll see.

## 2012-08-08 ENCOUNTER — Encounter: Payer: Self-pay | Admitting: Family Medicine

## 2012-08-08 LAB — URINALYSIS, ROUTINE W REFLEX MICROSCOPIC
Hgb urine dipstick: NEGATIVE
Ketones, ur: NEGATIVE mg/dL
Nitrite: NEGATIVE
Urobilinogen, UA: 0.2 mg/dL (ref 0.0–1.0)

## 2012-08-08 NOTE — Assessment & Plan Note (Signed)
Increase ritalin to 10mg  bid instead of just qd.

## 2012-08-08 NOTE — Assessment & Plan Note (Signed)
Will recheck UA with reflex microscopy today.  If neg, will assume previous dipstick UA was falsely positive. If positive, will further evaluate urinary tract for source of bleeding.

## 2012-08-08 NOTE — Assessment & Plan Note (Signed)
With diffuse myalgias, syndrome certainly consistent with fibromyalgia. Unfortunately we didn't discuss this much today at all, but she did say she has tried cymbalta "and all the others" for this and has had no success. She was started on savella at one point in the past but appears to not be aware of this and is not currently taking this. I think this is a reasonable med to try but will hold off at this time since she has so much else we are working on presently.

## 2012-08-08 NOTE — Assessment & Plan Note (Signed)
Success with chantix in the past. Will rx chantix again today.

## 2012-08-08 NOTE — Assessment & Plan Note (Addendum)
She has some component of wrist sprain, possible arthritis. Also has some sx's consistent with CTS but exam findings not confirmatory of this dx.  Ulnar neuropathy also seems to be present. I fit her in a wrist splint today to wear hs and for comfort during the day.  For neuropathic pain will start neurontin 300mg  qhs, titrate up by 300mg  per week until she is at 300mg  tid.   I encouraged her to get back in to see Dr. Newell Coral, as he would be the best clinician to further evaluate her current right arm neuro sx's.

## 2012-10-06 ENCOUNTER — Telehealth: Payer: Self-pay | Admitting: Family Medicine

## 2012-10-06 NOTE — Telephone Encounter (Signed)
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 They told her that it needs to have an expiration date of 2016. She needs to get it Monday 10/09/12 if possible, she needs to take it to [facility] to be able to drive on Wed 11/15/52. I advised patient that the doctor is out of the office but that I would forward the message to him.

## 2012-10-08 NOTE — Telephone Encounter (Signed)
I should have put an expiration date of 07/03/2014 on it.  What exp date did I put on it?  Something must have happened b/c why would they suddenly say this to her?  I'll be in on Tuesday morning for about 2 minutes and I'll sign it then if everything makes sense.-thx

## 2012-10-09 NOTE — Telephone Encounter (Signed)
LM on pt's cell for patient to [facility]

## 2012-10-09 NOTE — Telephone Encounter (Signed)
Patient is going to check to see if the card she has to be changed or if a new one needs to be done. She will CB.

## 2012-10-11 NOTE — Telephone Encounter (Signed)
Card faxed to patient, original in drawer for patient to pickup. Patient aware.

## 2012-12-04 ENCOUNTER — Encounter (HOSPITAL_BASED_OUTPATIENT_CLINIC_OR_DEPARTMENT_OTHER): Payer: Self-pay | Admitting: *Deleted

## 2012-12-04 NOTE — Progress Notes (Signed)
Not on any htn meds-not having to take any asthma meds-has had several surgeries here

## 2012-12-04 NOTE — H&P (Signed)
  [facility] [address]   [address] [place], [place] [address] [phone] A Division of [facility]  Loreta Ave, M.D.   Robert A. Thurston Hole, M.D.   Burnell Blanks, M.D.   Eulas Post, M.D.   Lunette Stands, M.D Buford Dresser, M.D.  Charlsie Quest, M.D.   Estell Harpin, M.D.   Melina Fiddler, M.D. Genene Churn. Barry Dienes, PA-C            Kirstin A. Shepperson, PA-C Josh [place], PA-C [place], [place]   RE: Hannah Garrett, [place]                                [phone]      DOB: [birth year] PROGRESS NOTE: 08-29-12 [age] year-old white female who returns to the office with complaints of left knee pain.  She had some improvement with previous intraarticular Depo-Medrol/Marcaine injection.  She has pain with prolonged ambulation, stairs and squatting.  Some feeling of popping medially.  She had an MRI scan on May 25, 2012 which showed moderate chondromalacia patellae and intact ligamentous structures and no meniscal tears.    EXAMINATION: Pleasant white female, alert and oriented x 3 and in no acute distress.  Gait is somewhat antalgic.  Good painless range of motion bilateral hips.  Left knee good range of motion.  She does have some patellofemoral crepitus.  Positive patellar grind.  Moderately to markedly tender medial plica.  Joint line less tender.  Ligaments stable.  Calf non-tender.  Neurovascularly intact.  Skin warm and dry.   DISPOSITION:  Patient advised that the best treatment option at this point would be outpatient arthroscopy with debridement, chondroplasty and excision of medial plica.  Surgical procedure, along with potential rehab/recovery time discussed.  All questions answered.  At the time of her surgery we will also be able to tell if she is a potential candidate for a patellofemoral unicompartmental replacement.  All questions answered.  Pre-op paperwork filled out.    Loreta Ave, M.D.     Electronically verified by Loreta Ave, M.D. DFM(JMO):jjh D 08-29-12 T 08-30-12

## 2012-12-07 ENCOUNTER — Encounter: Payer: Self-pay | Admitting: Nurse Practitioner

## 2012-12-07 ENCOUNTER — Ambulatory Visit (INDEPENDENT_AMBULATORY_CARE_PROVIDER_SITE_OTHER): Payer: BC Managed Care – PPO | Admitting: Nurse Practitioner

## 2012-12-07 ENCOUNTER — Ambulatory Visit (HOSPITAL_BASED_OUTPATIENT_CLINIC_OR_DEPARTMENT_OTHER)
Admission: RE | Admit: 2012-12-07 | Payer: BC Managed Care – PPO | Source: Ambulatory Visit | Admitting: Orthopedic Surgery

## 2012-12-07 VITALS — BP 130/80 | HR 79 | Temp 97.6°F | Ht 63.5 in | Wt 185.5 lb

## 2012-12-07 DIAGNOSIS — T798XXD Other early complications of trauma, subsequent encounter: Secondary | ICD-10-CM

## 2012-12-07 DIAGNOSIS — Z5189 Encounter for other specified aftercare: Secondary | ICD-10-CM

## 2012-12-07 SURGERY — ARTHROSCOPY, KNEE
Anesthesia: General | Site: Knee | Laterality: Left

## 2012-12-07 NOTE — Patient Instructions (Addendum)
Continue wound care as discussed-while there is yellow exudate, continue to cleanse with hydrogen peroxide using gentle friction with a cotton ball or gauze, twice daily. Do not pull off gauze if stuck to wound-wet gauze with running stream of water until it loosens and easily comes off. Once yellow exudate is gone, stop wet to dry dressings and apply thin layer of bacitracin and cover with bandage. Continue to wash with mild soap & water daily. If you see more redness or experience more pain, or develop fever, please call for re-evaluation. Continue antibiotics as prescribed. Do ankle & leg exercises to help resolve swelling. Feel better!

## 2012-12-07 NOTE — Progress Notes (Signed)
  Subjective:    Patient ID: Hannah Garrett, female    DOB: Jul 28, 1965, 47 y.o.   MRN: [id number]  Wound Check She was originally treated 5 to 10 days ago (wound occured 6 days ago, treatment started 4 days ago with antibiotics. essentially no wound care.). Previous treatment included wound cleansing or irrigation (pt treated at urgent care 4 days ago. started doxycycline). Maximum temperature: no fever. Wound drainage status: small amount yellow exudate. The redness has worsened. The swelling has worsened. The pain has worsened. There is difficulty moving the extremity or digit due to pain.      Review of Systems  Constitutional: Negative for fever and chills.  Cardiovascular: Positive for leg swelling (has occasional leg swelling unrelated to wound).  Gastrointestinal: Negative for nausea and abdominal pain.  Musculoskeletal: Positive for myalgias (Hx fibromyalgia).  Skin: Positive for wound.  Hematological: Negative for adenopathy.  Psychiatric/Behavioral:       Hx depression       Objective:   Physical Exam  Constitutional: She is oriented to person, place, and time. She appears well-developed and well-nourished. No distress.  Accompnaied by husband. Walking with cane due to R leg pain secondary to wound  HENT:  Head: Normocephalic and atraumatic.  Eyes: Conjunctivae are normal. Right eye exhibits no discharge. Left eye exhibits no discharge.  Wearing glasses  Cardiovascular: Normal rate.   Pulmonary/Chest: Effort normal.  Musculoskeletal: She exhibits edema and tenderness.  Neurological: She is alert and oriented to person, place, and time.  Skin: Skin is warm and dry. Abrasion (5" linear abrasion, Lateral aspect R lower leg. Wound is superficial, has small amount yellow exudate, some scabbing, minimal redness surrounding wound edges. moderate nonpitting edema mid lower leg t ankle  ) noted. There is erythema.  Psychiatric: She has a normal mood and affect. Her behavior is  normal. Thought content normal.   Wound care: Cleaned wound with hydrogen peroxide, using 4x4 gauze. Applied wet saline 4x4 gauze to wound, wrapped w/ace bandage.       Assessment & Plan:  1. Post-traumatic wound infection, subsequent encounter Continue antibiotics as prescribed. Begin wound care as discussed and demonstrated. See pt instructions.

## 2013-06-01 ENCOUNTER — Ambulatory Visit: Payer: BC Managed Care – PPO | Admitting: Family Medicine

## 2013-06-01 ENCOUNTER — Encounter: Payer: Self-pay | Admitting: Family Medicine

## 2013-06-01 VITALS — BP 125/85 | HR 98 | Temp 98.2°F | Resp 18 | Ht 63.5 in | Wt 186.0 lb

## 2013-06-01 DIAGNOSIS — Z23 Encounter for immunization: Secondary | ICD-10-CM

## 2013-06-01 MED ORDER — AMOXICILLIN-POT CLAVULANATE 875-125 MG PO TABS: 1.0000 | ORAL_TABLET | Freq: Two times a day (BID) | ORAL | Status: DC

## 2013-06-01 MED ORDER — LISDEXAMFETAMINE DIMESYLATE 30 MG PO CAPS: 30.0000 mg | ORAL_CAPSULE | Freq: Every day | ORAL | Status: DC

## 2013-06-01 NOTE — Progress Notes (Signed)
Pre visit review using our clinic review tool, if applicable. No additional management support is needed unless otherwise documented below in the visit note. 

## 2013-06-01 NOTE — Progress Notes (Signed)
OFFICE NOTE  06/01/2013  CC:  Chief Complaint  Patient presents with  . Other    having trouble concentrating     HPI: Patient is a 47 y.o. Caucasian female who is here for concentration problems. I last saw her for this 07/2012 and increased her ritalin 10mg  to bid instead of qd. She does not feel like this is helping any at all.  She has had this problem for at least the last few years. Still with lots of c/o: poor concentration, easily distracted, trouble finishing things.  Seems to easily daydream, lose her place in a conversation, procrastinates in times of lots of work or lots of demands.  Says mood is fine.  Feels some stress/anxiety at work but not chronically anxious.  Appetite is fine. Sleep is ok except some recurrent knee pain is bothering her and waking her up several times per night.  She is currently getting some hyaluronic acid knee injections with her ortho MD, who also did some surgery on left knee 12/2012.  The thought is that she likely needs partial knee replacement soon (Dr. Eulah Pont). She is unsure if she has had any other ADHD meds in the past.    Also reports a couple of weeks of nasal congestion with PND.  Some cough.   No fever.  Peri-orbital headaches, upper teeth hurting diffusely.  Reports sticky whitish nasal mucous.  Pertinent PMH:  Fibromyalgia Depression Tob abuse Migraine HA's Fatigue Asthma Adult ADHD Recurrent sinusitis with hx of sinus surgery x 2  MEDS:  Outpatient Prescriptions Prior to Visit  Medication Sig Dispense Refill  . albuterol (VENTOLIN HFA) 108 (90 BASE) MCG/ACT inhaler Inhale 2 puffs into the lungs every 6 (six) hours as needed for wheezing.  1 Inhaler  5  . budesonide (RHINOCORT AQUA) 32 MCG/ACT nasal spray Place 1 spray into the nose daily.  1 Bottle  3  . budesonide-formoterol (SYMBICORT) 80-4.5 MCG/ACT inhaler Inhale 1 puff into the lungs 2 (two) times daily.  1 Inhaler  12  . doxycycline (VIBRAMYCIN) 100 MG capsule Take 100  mg by mouth 2 (two) times daily.      Marland Kitchen loratadine (CLARITIN) 10 MG tablet Take 1 tablet (10 mg total) by mouth 2 (two) times daily.  30 tablet  11  . methylphenidate (RITALIN) 10 MG tablet 1 tab po bid  60 tablet  0   No facility-administered medications prior to visit.  **Not taking doxy listed above  PE: Blood pressure 125/85, pulse 98, temperature 98.2 F (36.8 C), temperature source Temporal, resp. rate 18, height 5' 3.5" (1.613 m), weight 186 lb (84.369 kg), SpO2 99.00%. VS: noted--normal. Gen: alert, NAD, NONTOXIC APPEARING. HEENT: eyes without injection, drainage, or swelling.  Ears: EACs clear, TMs with normal light reflex and landmarks.  Nose: Clear rhinorrhea, with some dried, crusty exudate adherent to mildly injected mucosa.  No purulent d/c.  Mild diffuse paranasal sinus TTP.  No facial swelling.  Throat and mouth without focal lesion.  No pharyngial swelling, erythema, or exudate.   Neck: supple, no LAD.   LUNGS: CTA bilat, nonlabored resps.   CV: RRR, no m/r/g. EXT: no c/c/e  SKIN: no rash NEURO: no tremor.  Nonfocal.   IMPRESSION AND PLAN:  1) Acute sinusitis: augmentin 875mg  bid x 14d. Symptomatic care discussed.  2) Adult ADHD: ritalin 10mg  bid no help. Will do trial of vyvanse 30mg  qd.  Call in 2-3 wks if this dosing is helpful but needs up-titration.  Flu vaccine IM today.  An After Visit Summary was printed and given to the patient.  FOLLOW UP: 1 mo

## 2013-07-02 ENCOUNTER — Ambulatory Visit: Payer: BC Managed Care – PPO | Admitting: Family Medicine

## 2013-07-10 ENCOUNTER — Encounter: Payer: BC Managed Care – PPO | Admitting: Family Medicine

## 2013-07-16 ENCOUNTER — Encounter: Payer: Self-pay | Admitting: Family Medicine

## 2013-07-17 ENCOUNTER — Encounter: Payer: Self-pay | Admitting: Family Medicine

## 2013-07-17 ENCOUNTER — Telehealth: Payer: Self-pay | Admitting: Family Medicine

## 2013-07-17 ENCOUNTER — Ambulatory Visit (INDEPENDENT_AMBULATORY_CARE_PROVIDER_SITE_OTHER): Payer: BC Managed Care – PPO | Admitting: Family Medicine

## 2013-07-17 ENCOUNTER — Ambulatory Visit (HOSPITAL_BASED_OUTPATIENT_CLINIC_OR_DEPARTMENT_OTHER)
Admission: RE | Admit: 2013-07-17 | Discharge: 2013-07-17 | Disposition: A | Discharge status: Home / Self Care | Payer: BC Managed Care – PPO | Source: Ambulatory Visit | Attending: Family Medicine | Admitting: Family Medicine

## 2013-07-17 VITALS — BP 128/79 | HR 95 | Temp 98.4°F | Resp 18 | Ht 63.5 in | Wt 183.0 lb

## 2013-07-17 DIAGNOSIS — J45909 Unspecified asthma, uncomplicated: Secondary | ICD-10-CM

## 2013-07-17 DIAGNOSIS — Z981 Arthrodesis status: Secondary | ICD-10-CM | POA: Insufficient documentation

## 2013-07-17 DIAGNOSIS — F17200 Nicotine dependence, unspecified, uncomplicated: Secondary | ICD-10-CM | POA: Insufficient documentation

## 2013-07-17 DIAGNOSIS — Z0000 Encounter for general adult medical examination without abnormal findings: Secondary | ICD-10-CM

## 2013-07-17 DIAGNOSIS — F909 Attention-deficit hyperactivity disorder, unspecified type: Secondary | ICD-10-CM

## 2013-07-17 DIAGNOSIS — J4520 Mild intermittent asthma, uncomplicated: Secondary | ICD-10-CM

## 2013-07-17 DIAGNOSIS — Z01818 Encounter for other preprocedural examination: Secondary | ICD-10-CM | POA: Insufficient documentation

## 2013-07-17 LAB — CBC WITH DIFFERENTIAL/PLATELET
BASOS PCT: 1 % (ref 0.0–3.0)
Basophils Absolute: 0.1 10*3/uL (ref 0.0–0.1)
EOS PCT: 1.6 % (ref 0.0–5.0)
Eosinophils Absolute: 0.1 10*3/uL (ref 0.0–0.7)
HEMATOCRIT: 44.7 % (ref 36.0–46.0)
Hemoglobin: 14.7 g/dL (ref 12.0–15.0)
LYMPHS ABS: 3.1 10*3/uL (ref 0.7–4.0)
Lymphocytes Relative: 34.4 % (ref 12.0–46.0)
MCHC: 32.8 g/dL (ref 30.0–36.0)
MCV: 90.9 fl (ref 78.0–100.0)
MONO ABS: 0.5 10*3/uL (ref 0.1–1.0)
Monocytes Relative: 5.3 % (ref 3.0–12.0)
Neutro Abs: 5.2 10*3/uL (ref 1.4–7.7)
Neutrophils Relative %: 57.7 % (ref 43.0–77.0)
PLATELETS: 298 10*3/uL (ref 150.0–400.0)
RBC: 4.92 Mil/uL (ref 3.87–5.11)
RDW: 13.7 % (ref 11.5–14.6)
WBC: 9.1 10*3/uL (ref 4.5–10.5)

## 2013-07-17 LAB — COMPREHENSIVE METABOLIC PANEL
ALBUMIN: 4.1 g/dL (ref 3.5–5.2)
ALT: 14 U/L (ref 0–35)
AST: 15 U/L (ref 0–37)
Alkaline Phosphatase: 82 U/L (ref 39–117)
BUN: 11 mg/dL (ref 6–23)
CALCIUM: 9.3 mg/dL (ref 8.4–10.5)
CHLORIDE: 103 meq/L (ref 96–112)
CO2: 26 mEq/L (ref 19–32)
Creatinine, Ser: 0.7 mg/dL (ref 0.4–1.2)
GFR: 101.52 mL/min (ref >60.00)
Glucose, Bld: 72 mg/dL (ref 70–99)
POTASSIUM: 4.1 meq/L (ref 3.5–5.1)
SODIUM: 136 meq/L (ref 135–145)
Total Bilirubin: 0.6 mg/dL (ref 0.3–1.2)
Total Protein: 7.6 g/dL (ref 6.0–8.3)

## 2013-07-17 LAB — TSH: TSH: 0.96 u[IU]/mL (ref 0.35–5.50)

## 2013-07-17 LAB — LIPID PANEL
CHOL/HDL RATIO: 8
Cholesterol: 280 mg/dL — ABNORMAL HIGH (ref 0–200)
HDL: 37.3 mg/dL — ABNORMAL LOW (ref >39.00)
Triglycerides: 384 mg/dL — ABNORMAL HIGH (ref 0.0–149.0)
VLDL: 76.8 mg/dL — ABNORMAL HIGH (ref 0.0–40.0)

## 2013-07-17 MED ORDER — LISDEXAMFETAMINE DIMESYLATE 40 MG PO CAPS: 40.0000 mg | ORAL_CAPSULE | ORAL | Status: DC

## 2013-07-17 NOTE — Progress Notes (Signed)
Pre visit review using our clinic review tool, if applicable. No additional management support is needed unless otherwise documented below in the visit note. 

## 2013-07-17 NOTE — Assessment & Plan Note (Signed)
Decent response to vyvanse 30mg  qd. Duration of effect adequate. Will go ahead and increase slightly to 40mg  qd dosing.

## 2013-07-17 NOTE — Assessment & Plan Note (Signed)
Encouraged cessation.  She asks about "a prescription pill that can help you quit smoking AND help you lose wt". I encouraged her to continue to try to make TLC's on her own w/out meds at this time but in the future we could discuss med options for smoking cessation and/or wt loss.

## 2013-07-17 NOTE — Telephone Encounter (Signed)
Relevant patient education assigned to patient using Emmi. ° °

## 2013-07-17 NOTE — Progress Notes (Signed)
Office Note 07/17/2013  CC:  Chief Complaint  Patient presents with  . Annual Exam    HPI:  Hannah Garrett is a 48 y.o. White female who is here for CPE, needs clearance for upcoming left knee surgery (partial replacement: Dr. Kathryne Hitch).  Currently feeling well.  Denies CP/SOB/Arm pain/nausea/palpitations with exertion or at rest. She uses her rescue albut only about 4 times per year per her report today.  Current smoker, says about 1/2 pack per day avg.  Vyvanse 30mg  qd helped with adult ADD sx's.  Dry mouth side effect.  She feels like she needs a little bit higher dose.  She took this for 30d but has been out of it for a while. She feels like it lasts about 10 hours.     Past Medical History  Diagnosis Date  . Hyperlipidemia, mixed   . Arthritis   . IBS (irritable bowel syndrome)   . Fibromyalgia   . Asthma 10/20/2010    Much worse as a child, only prn albut as adult  . Depression 10/20/2010  . Right shoulder pain 10/20/2010  . Neck pain, chronic 10/20/2010  . Allergic rhinitis 10/21/2010  . Recurrent sinusitis 10/21/2010  . Irritable bowel syndrome (IBS) 10/21/2010  . BCC (basal cell carcinoma), face 10/21/2010  . Endometriosis 10/21/2010  . Urge incontinence 10/21/2010  . Plantar fasciitis 10/21/2010  . Tobacco abuse 10/21/2010  . Back pain 10/21/2010  . Headache, migraine 10/21/2010  . Headache, tension-type 10/21/2010  . 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 Overweight 10/21/2010  . Fatigue 10/21/2010  . Peripheral edema 10/21/2010  . Insomnia 02/10/2011  . Adult ADHD 02/10/2011    Past Surgical History  Procedure Laterality Date  . Tonsillectomy and adenoidectomy  1974  . Right knee  1987  . Cesarean section  1988  . Dilation and curettage of uterus  1994 and 1995  . Left knee  2002  . Neck surgery  2003 & 2005  . Left shoulder  2004  . Abdominal hysterectomy  2004    for endometriosis  . Shock wave on right foot  2009  . Left foot surgery  2010  . Nose surgery  26 and 48 yrs old  . Sinus  surgeries  2005 &2007    Family History  Problem Relation Age of Onset  . Hypertension Mother   . Diabetes Mother     borderline  . Asthma Mother   . Arthritis Mother   . Hypertension Father   . Arthritis Father   . Cancer Father     prostate  . Heart disease Father     s/p MI  . Asthma Daughter   . Migraines Daughter   . GER disease Daughter   . Cancer Son     CA- calcification, right lower ribs was causing significant breathing trouble after returning from [place]  . Heart murmur Son   . Heart disease Maternal Grandmother   . Diabetes Maternal Grandfather   . Cancer Paternal Grandmother     stomach  . Asthma Daughter   . COPD Brother     smoker, lung disease    History   Social History  . Marital Status: Married    Spouse Name: N/A    Number of Children: N/A  . Years of Education: N/A   Occupational History  . Not on file.   Social History Main Topics  . Smoking status: Current Some Day Smoker -- 0.25 packs/day    Types: Cigarettes  .  Smokeless tobacco: Never Used  . Alcohol Use: Yes     Comment: occasional  . Drug Use: No  . Sexual Activity: Yes   Other Topics Concern  . Not on file   Social History Narrative   Married, 4 children.   Orig from [place] area.   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   Tobacco: 30 pack-yr hx (current as of 07/2013).    Occasional alcohol.  No drugs.    Outpatient Prescriptions Prior to Visit  Medication Sig Dispense Refill  . albuterol (VENTOLIN HFA) 108 (90 BASE) MCG/ACT inhaler Inhale 2 puffs into the lungs every 6 (six) hours as needed for wheezing.  1 Inhaler  5  . loratadine (CLARITIN) 10 MG tablet Take 1 tablet (10 mg total) by mouth 2 (two) times daily.  30 tablet  11  . lisdexamfetamine (VYVANSE) 30 MG capsule Take 1 capsule (30 mg total) by mouth daily.  30 capsule  0  . amoxicillin-clavulanate (AUGMENTIN) 875-125 MG per tablet Take 1 tablet by mouth 2 (two) times daily.  28 tablet  0  . budesonide  (RHINOCORT AQUA) 32 MCG/ACT nasal spray Place 1 spray into the nose daily.  1 Bottle  3  . budesonide-formoterol (SYMBICORT) 80-4.5 MCG/ACT inhaler Inhale 1 puff into the lungs 2 (two) times daily.  1 Inhaler  12  . doxycycline (VIBRAMYCIN) 100 MG capsule Take 100 mg by mouth 2 (two) times daily.      . methylphenidate (RITALIN) 10 MG tablet 1 tab po bid  60 tablet  0   No facility-administered medications prior to visit.  Not taking augmentin, doxy, symbicort, rhinocort, or ritalin at this time.  Allergies  Allergen Reactions  . Codeine Hives  . Darvocet [Propoxyphene N-Acetaminophen] Nausea And Vomiting  . Percocet [Oxycodone-Acetaminophen] Nausea And Vomiting  . Hydrocodone Nausea And Vomiting    ROS Review of Systems  Constitutional: Negative for fever, chills, appetite change and fatigue.  HENT: Negative for congestion, dental problem, ear pain and sore throat.   Eyes: Negative for discharge, redness and visual disturbance.  Respiratory: Negative for cough, chest tightness, shortness of breath and wheezing.   Cardiovascular: Negative for chest pain, palpitations and leg swelling.  Gastrointestinal: Negative for nausea, vomiting, abdominal pain, diarrhea and blood in stool.  Genitourinary: Negative for dysuria, urgency, frequency, hematuria, flank pain and difficulty urinating.  Musculoskeletal: Negative for arthralgias, back pain, joint swelling, myalgias and neck stiffness.  Skin: Negative for pallor and rash.  Neurological: Positive for light-headedness (intermittent, with postural changes esp). Negative for dizziness, speech difficulty, weakness and headaches.  Hematological: Negative for adenopathy. Does not bruise/bleed easily.  Psychiatric/Behavioral: Negative for confusion and sleep disturbance. The patient is not nervous/anxious.      PE; Blood pressure 128/79, pulse 95, temperature 98.4 F (36.9 C), temperature source Temporal, resp. rate 18, height 5' 3.5" (1.613 m),  weight 183 lb (83.008 kg), SpO2 99.00%. Gen: Alert, well appearing.  Patient is oriented to person, place, time, and situation. AFFECT: pleasant, lucid thought and speech. ENT: Ears: EACs clear, normal epithelium.  TMs with good light reflex and landmarks bilaterally.  Eyes: no injection, icteris, swelling, or exudate.  EOMI, PERRLA. Nose: no drainage or turbinate edema/swelling.  No injection or focal lesion.  Mouth: lips without lesion/swelling.  Oral mucosa pink and moist.  Dentition intact and without obvious caries or gingival swelling.  Oropharynx without erythema, exudate, or swelling.  Neck: supple/nontender.  No LAD, mass, or TM.  Carotid pulses 2+ bilaterally,  without bruits. CV: RRR, no m/r/g.   LUNGS: CTA bilat, nonlabored resps, good aeration in all lung fields. ABD: soft, diffuse mild abdom TTP w/out guarding or rebound, ND, BS normal.  No hepatospenomegaly or mass.  No bruits. EXT: no clubbing, cyanosis, or edema.  Musculoskeletal: no joint swelling, erythema, warmth, or tenderness.  ROM of all joints intact. Skin - no sores or suspicious lesions or rashes or color changes  Pertinent labs:  None today  12 lead EKG today: NSR, rate 92, low voltages in all leads--consider pulm dz.  No ischemic changes, no ectopy.  NO prior EKG for comparison.  ASSESSMENT AND PLAN:   Health maintenance examination Reviewed age and gender appropriate health maintenance issues (prudent diet, regular exercise, health risks of tobacco and excessive alcohol, use of seatbelts, fire alarms in home, use of sunscreen).  Also reviewed age and gender appropriate health screening as well as vaccine recommendations. Fasting health panel labs done today. EKG ok. CXR pending. At this point, she is ok to proceed with her upcoming orthopedic surgery. Encouraged her to quit smoking.  Adult ADHD Decent response to vyvanse 30mg  qd. Duration of effect adequate. Will go ahead and increase slightly to 40mg  qd  dosing.  Tobacco dependence Encouraged cessation.  She asks about "a prescription pill that can help you quit smoking AND help you lose wt". I encouraged her to continue to try to make TLC's on her own w/out meds at this time but in the future we could discuss med options for smoking cessation and/or wt loss.   An After Visit Summary was printed and given to the patient.  FOLLOW UP:  Return in about 1 month (around 08/14/2013) for f/u ADD, tob dep.

## 2013-07-17 NOTE — Assessment & Plan Note (Signed)
Reviewed age and gender appropriate health maintenance issues (prudent diet, regular exercise, health risks of tobacco and excessive alcohol, use of seatbelts, fire alarms in home, use of sunscreen).  Also reviewed age and gender appropriate health screening as well as vaccine recommendations. Fasting health panel labs done today. EKG ok. CXR pending. At this point, she is ok to proceed with her upcoming orthopedic surgery. Encouraged her to quit smoking.

## 2013-07-18 ENCOUNTER — Encounter: Payer: Self-pay | Admitting: Family Medicine

## 2013-07-18 LAB — LDL CHOLESTEROL, DIRECT: Direct LDL: 178.3 mg/dL

## 2013-07-19 MED ORDER — ATORVASTATIN CALCIUM 20 MG PO TABS: 20.0000 mg | ORAL_TABLET | Freq: Every day | ORAL | Status: DC

## 2013-07-19 NOTE — Addendum Note (Signed)
Addended by: Ralph Dowdy on: 07/19/2013 01:14 PM   Modules accepted: Orders

## 2013-07-24 IMAGING — US US PELVIS COMPLETE
1 series · 14 of 25 positions shown · non-contrast
Comparison: None.

CLINICAL DATA: Pain, nausea, vomiting, history of endometriosis

TRANSABDOMINAL AND TRANSVAGINAL ULTRASOUND OF PELVIS
TECHNIQUE: Both transabdominal and transvaginal ultrasound
examinations of the pelvis were performed. Transabdominal technique
was performed for global imaging of the pelvis including uterus,
ovaries, adnexal regions, and pelvic cul-de-sac.

[Series 1: us pelvis complete · 0.37mm/px · 14 of 61 slices shown]
[im 1/61]
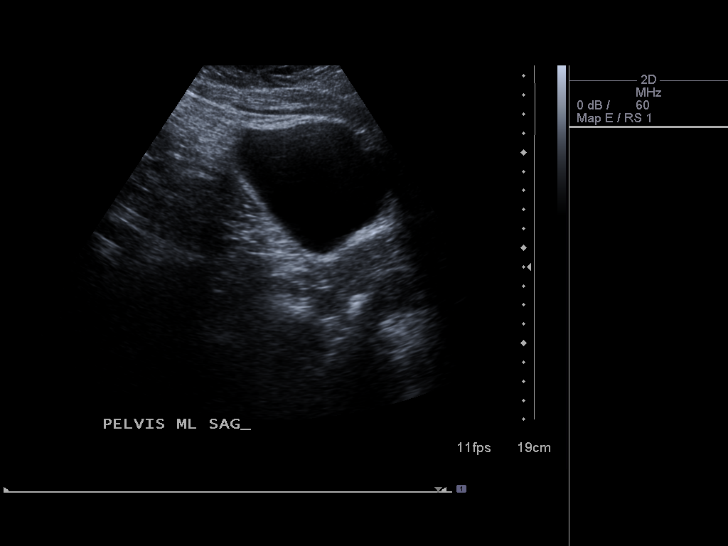
[im 6/61]
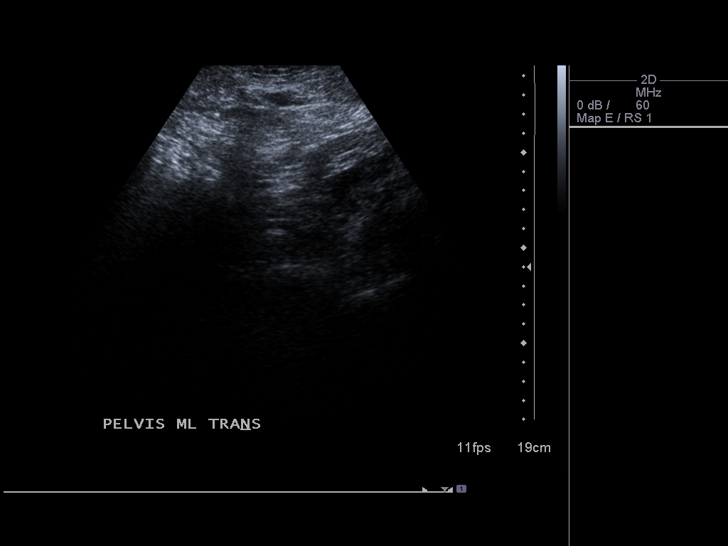
[im 11/61]
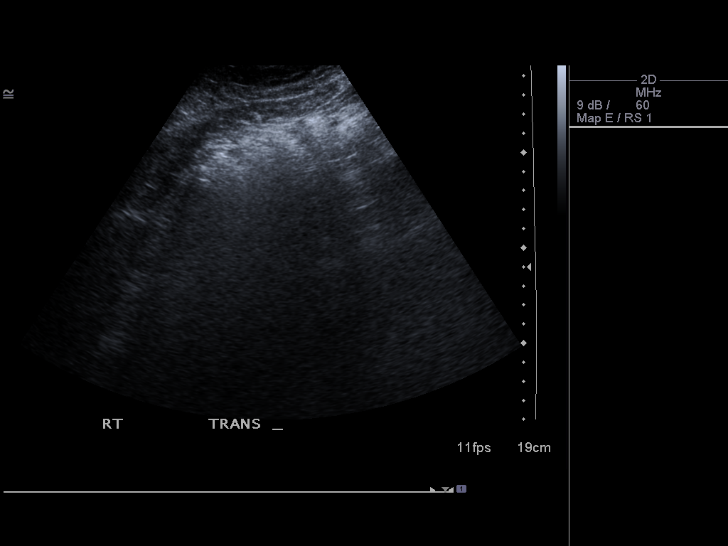
[im 16/61]
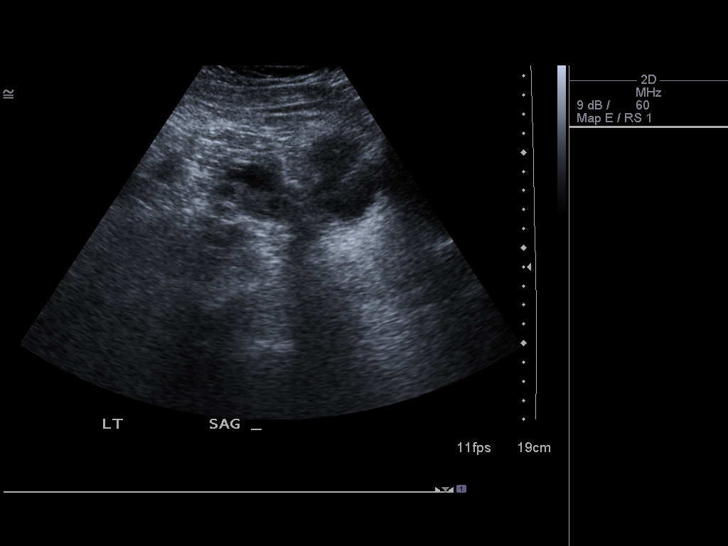
[im 21/61]
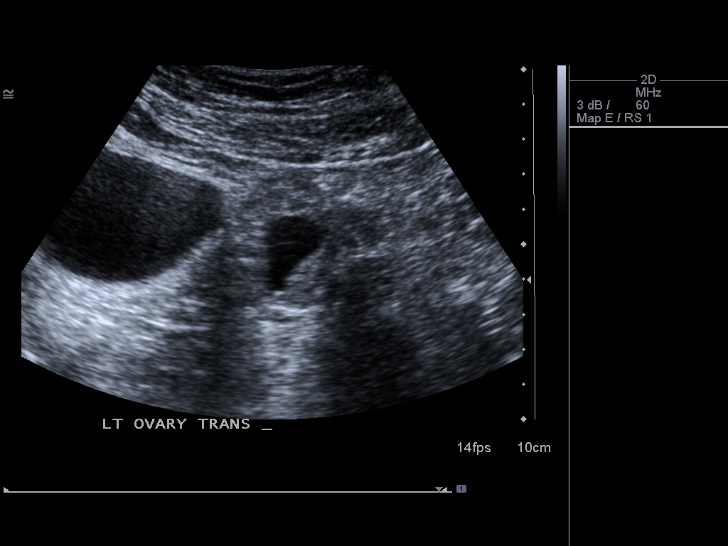
[im 23/61]
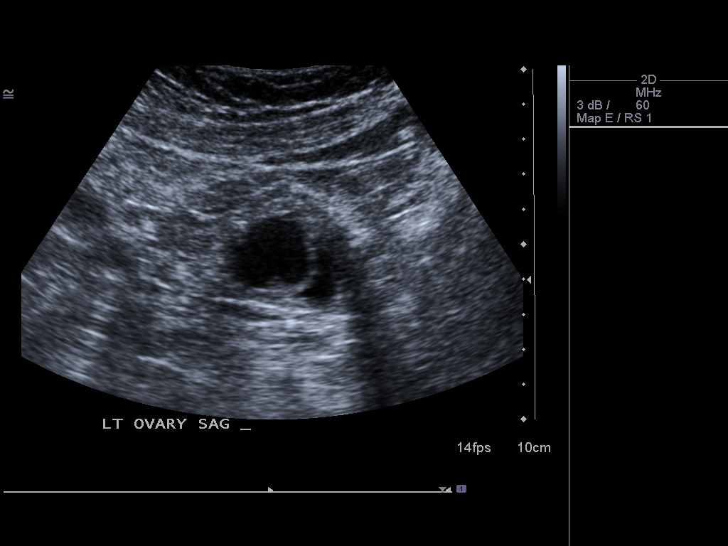
[im 28/61]
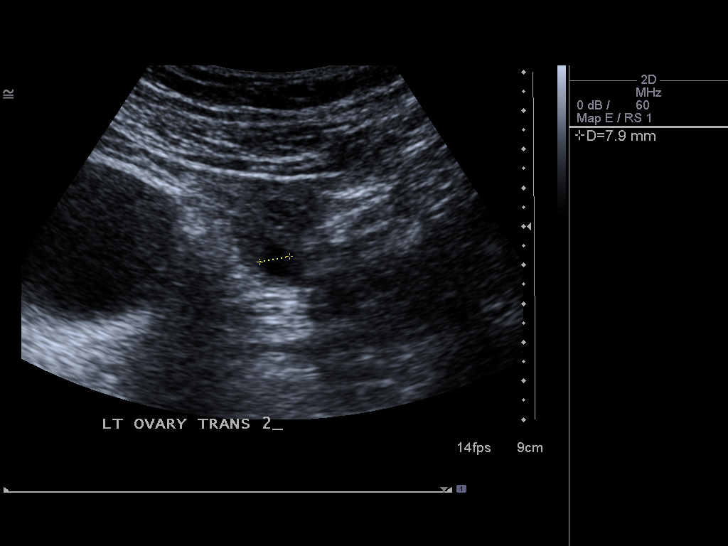
[im 33/61]
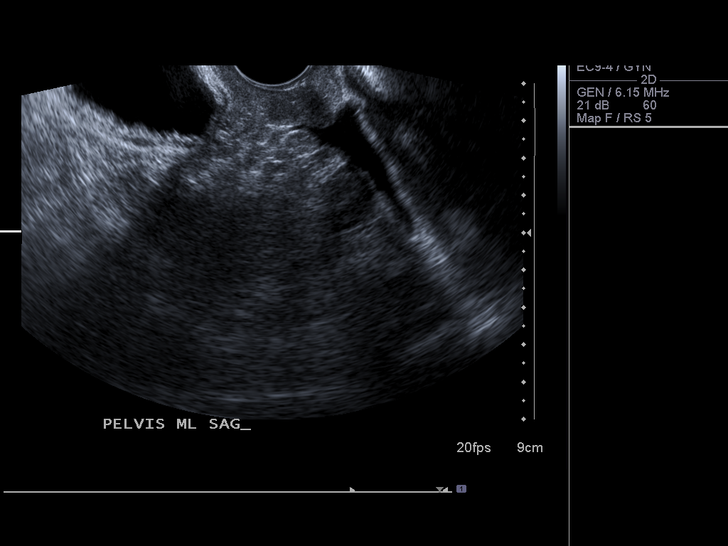
[im 38/61]
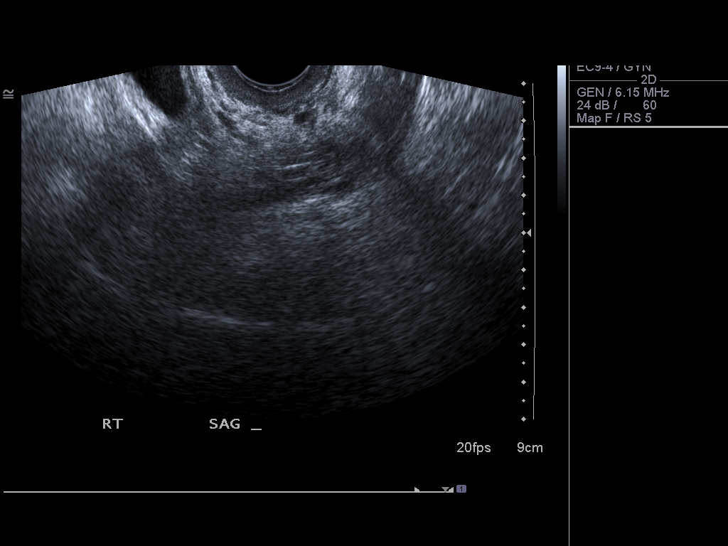
[im 41/61]
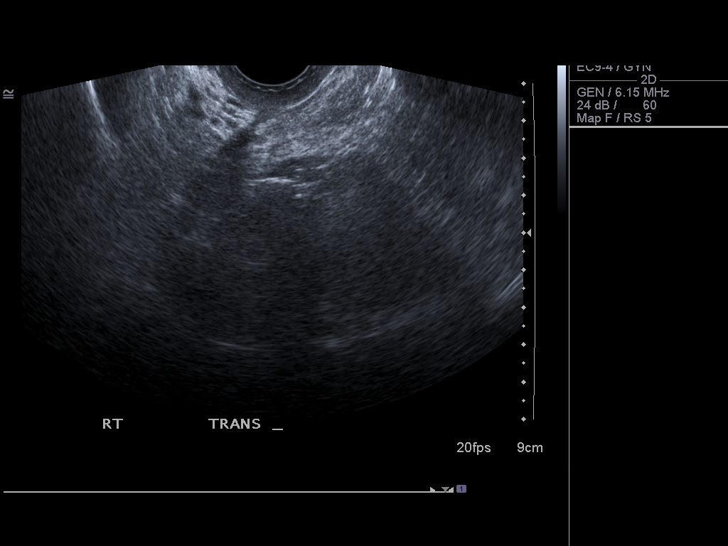
[im 46/61]
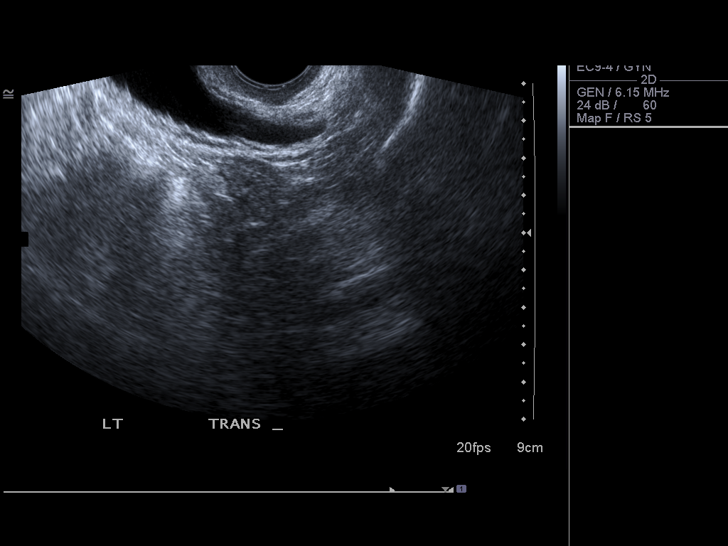
[im 51/61]
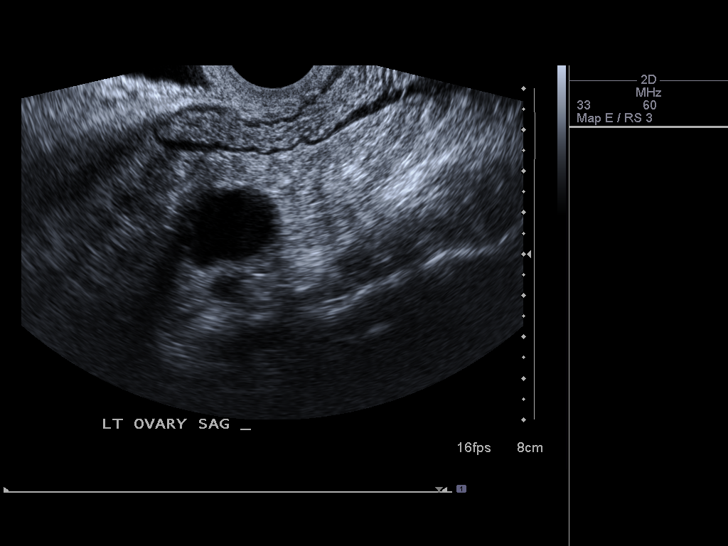
[im 56/61]
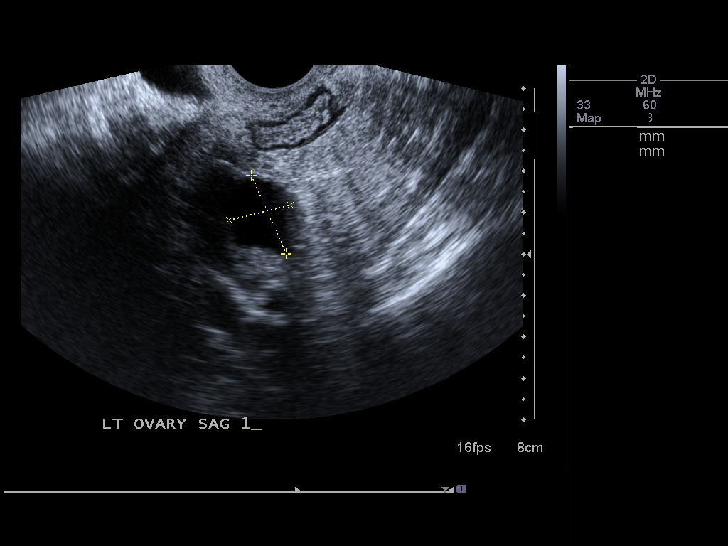
[im 61/61]
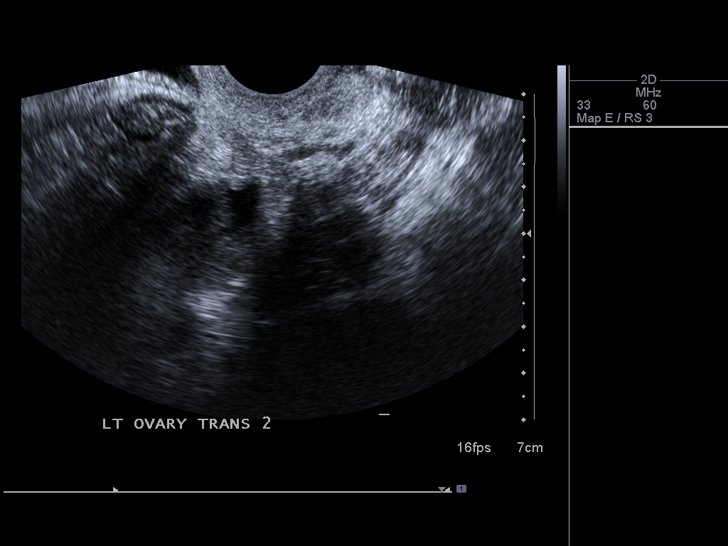

[14 of 25 positions shown; findings below may reference images not displayed]

It was necessary to proceed with endovaginal exam following the
transabdominal exam to visualize the adnexa.
FINDINGS: The uterus is surgically absent.  The right ovary is not
identified and may also be surgically absent.

The left ovary has a normal size and appearance.  The left ovarian
dimensions are 3.9 x 2.8 x 2.1 cm. There  are several small simple
cysts on the left ovary, the largest of which measures 2.8 cm which
is within normal limits.  There are no adnexal masses.  Trace free
pelvic fluid is present.
IMPRESSION: Normal study. No evidence of pelvic mass or other significant
abnormality.

## 2013-08-10 ENCOUNTER — Ambulatory Visit: Payer: BC Managed Care – PPO | Admitting: Family Medicine

## 2013-10-26 ENCOUNTER — Ambulatory Visit (INDEPENDENT_AMBULATORY_CARE_PROVIDER_SITE_OTHER): Payer: BC Managed Care – PPO | Admitting: Nurse Practitioner

## 2013-10-26 ENCOUNTER — Encounter: Payer: Self-pay | Admitting: Nurse Practitioner

## 2013-10-26 ENCOUNTER — Telehealth: Payer: Self-pay | Admitting: *Deleted

## 2013-10-26 VITALS — BP 136/87 | HR 88 | Temp 98.2°F | Ht 63.5 in | Wt 186.5 lb

## 2013-10-26 DIAGNOSIS — J019 Acute sinusitis, unspecified: Secondary | ICD-10-CM

## 2013-10-26 MED ORDER — AMOXICILLIN-POT CLAVULANATE 875-125 MG PO TABS: 1.0000 | ORAL_TABLET | Freq: Two times a day (BID) | ORAL | Status: DC

## 2013-10-26 NOTE — Progress Notes (Signed)
   Subjective:    Patient ID: Hannah Garrett, female    DOB: 05-Oct-1965, 48 y.o.   MRN: [id number]  Sinusitis This is a new problem. The current episode started 1 to 4 weeks ago (2w). The problem has been gradually worsening since onset. There has been no fever. The pain is moderate. Associated symptoms include congestion, coughing, headaches, sinus pressure and a sore throat. Pertinent negatives include no chills or shortness of breath. Treatments tried: advil cold & sinus. The treatment provided no relief.      Review of Systems  Constitutional: Positive for fatigue. Negative for fever and chills.  HENT: Positive for congestion, sinus pressure and sore throat.   Respiratory: Positive for cough. Negative for chest tightness, shortness of breath and wheezing.   Neurological: Positive for headaches.       Objective:   Physical Exam  Vitals reviewed. Constitutional: She is oriented to person, place, and time. She appears well-developed and well-nourished. No distress.  HENT:  Head: Normocephalic and atraumatic.  Right Ear: External ear normal.  Left Ear: External ear normal.  Mouth/Throat: Oropharynx is clear and moist. No oropharyngeal exudate.  Thick sticky mucous in nose   Eyes: Conjunctivae are normal. Right eye exhibits no discharge. Left eye exhibits no discharge.  Neck: Normal range of motion. Neck supple. No thyromegaly present.  Cardiovascular: Normal rate, regular rhythm and normal heart sounds.   No murmur heard. Pulmonary/Chest: Effort normal and breath sounds normal. No respiratory distress. She has no wheezes. She has no rales.  Lymphadenopathy:    She has no cervical adenopathy.  Neurological: She is alert and oriented to person, place, and time.  Skin: Skin is warm and dry.  Psychiatric: She has a normal mood and affect. Her behavior is normal. Thought content normal.          Assessment & Plan:  1. Acute sinus infection Daily sinus rinse -  amoxicillin-clavulanate (AUGMENTIN) 875-125 MG per tablet; Take 1 tablet by mouth 2 (two) times daily.  Dispense: 10 tablet; Refill: 0

## 2013-10-26 NOTE — Telephone Encounter (Signed)
Resent Augmentin rx to [facility] in [place].

## 2013-10-26 NOTE — Patient Instructions (Signed)
Start antibiotic. Eat yogurt daily at lunch or afternoon to help prevent diarrhea that can be caused by antibiotic. Start daily sinus rinses (Neilmed Sinus rinse) for at least 5-7 days. Please call for re-evaluation if you are not improving.   Sinusitis Sinusitis is redness, soreness, and swelling (inflammation) of the paranasal sinuses. Paranasal sinuses are air pockets within the bones of your face (beneath the eyes, the middle of the forehead, or above the eyes). In healthy paranasal sinuses, mucus is able to drain out, and air is able to circulate through them by way of your nose. However, when your paranasal sinuses are inflamed, mucus and air can become trapped. This can allow bacteria and other germs to grow and cause infection. Sinusitis can develop quickly and last only a short time (acute) or continue over a long period (chronic). Sinusitis that lasts for more than 12 weeks is considered chronic.  CAUSES  Causes of sinusitis include:  Allergies.  Structural abnormalities, such as displacement of the cartilage that separates your nostrils (deviated septum), which can decrease the air flow through your nose and sinuses and affect sinus drainage.  Functional abnormalities, such as when the small hairs (cilia) that line your sinuses and help remove mucus do not work properly or are not present. SYMPTOMS  Symptoms of acute and chronic sinusitis are the same. The primary symptoms are pain and pressure around the affected sinuses. Other symptoms include:  Upper toothache.  Earache.  Headache.  Bad breath.  Decreased sense of smell and taste.  A cough, which worsens when you are lying flat.  Fatigue.  Fever.  Thick drainage from your nose, which often is green and may contain pus (purulent).  Swelling and warmth over the affected sinuses. DIAGNOSIS  Your caregiver will perform a physical exam. During the exam, your caregiver may:  Look in your nose for signs of abnormal  growths in your nostrils (nasal polyps).  Tap over the affected sinus to check for signs of infection.  View the inside of your sinuses (endoscopy) with a special imaging device with a light attached (endoscope), which is inserted into your sinuses. If your caregiver suspects that you have chronic sinusitis, one or more of the following tests may be recommended:  Allergy tests.  Nasal culture A sample of mucus is taken from your nose and sent to a lab and screened for bacteria.  Nasal cytology A sample of mucus is taken from your nose and examined by your caregiver to determine if your sinusitis is related to an allergy. TREATMENT  Most cases of acute sinusitis are related to a viral infection and will resolve on their own within 10 days. Sometimes medicines are prescribed to help relieve symptoms (pain medicine, decongestants, nasal steroid sprays, or saline sprays).  However, for sinusitis related to a bacterial infection, your caregiver will prescribe antibiotic medicines. These are medicines that will help kill the bacteria causing the infection.  Rarely, sinusitis is caused by a fungal infection. In theses cases, your caregiver will prescribe antifungal medicine. For some cases of chronic sinusitis, surgery is needed. Generally, these are cases in which sinusitis recurs more than 3 times per year, despite other treatments. HOME CARE INSTRUCTIONS   Drink plenty of water. Water helps thin the mucus so your sinuses can drain more easily.  Use a humidifier.  Inhale steam 3 to 4 times a day (for example, sit in the bathroom with the shower running).  Apply a warm, moist washcloth to your face 3 to   4 times a day, or as directed by your caregiver.  Use saline nasal sprays to help moisten and clean your sinuses.  Take over-the-counter or prescription medicines for pain, discomfort, or fever only as directed by your caregiver. SEEK IMMEDIATE MEDICAL CARE IF:  You have increasing pain or  severe headaches.  You have nausea, vomiting, or drowsiness.  You have swelling around your face.  You have vision problems.  You have a stiff neck.  You have difficulty breathing. MAKE SURE YOU:   Understand these instructions.  Will watch your condition.  Will get help right away if you are not doing well or get worse. Document Released: 05/31/2005 Document Revised: 08/23/2011 Document Reviewed: 06/15/2011 Exit[employer] Patient Information 2014 [employer]. 

## 2013-10-26 NOTE — Progress Notes (Signed)
Pre visit review using our clinic review tool, if applicable. No additional management support is needed unless otherwise documented below in the visit note. 

## 2013-12-18 ENCOUNTER — Other Ambulatory Visit (HOSPITAL_COMMUNITY): Payer: BC Managed Care – PPO

## 2013-12-25 ENCOUNTER — Encounter (HOSPITAL_COMMUNITY): Payer: Self-pay | Admitting: Certified Registered Nurse Anesthetist

## 2013-12-26 ENCOUNTER — Encounter (HOSPITAL_COMMUNITY): Admission: RE | Payer: Self-pay | Source: Ambulatory Visit

## 2013-12-26 ENCOUNTER — Inpatient Hospital Stay (HOSPITAL_COMMUNITY)
Admission: RE | Admit: 2013-12-26 | Payer: Managed Care, Other (non HMO) | Source: Ambulatory Visit | Admitting: Orthopedic Surgery

## 2013-12-26 SURGERY — ARTHROPLASTY, KNEE, UNICOMPARTMENTAL
Anesthesia: General | Laterality: Left

## 2014-03-12 ENCOUNTER — Encounter: Payer: Self-pay | Admitting: Nurse Practitioner

## 2014-03-12 ENCOUNTER — Ambulatory Visit (INDEPENDENT_AMBULATORY_CARE_PROVIDER_SITE_OTHER): Payer: BC Managed Care – PPO | Admitting: Nurse Practitioner

## 2014-03-12 VITALS — BP 119/83 | HR 77 | Temp 98.0°F | Ht 63.5 in | Wt 186.4 lb

## 2014-03-12 DIAGNOSIS — H6980 Other specified disorders of Eustachian tube, unspecified ear: Secondary | ICD-10-CM

## 2014-03-12 DIAGNOSIS — H6981 Other specified disorders of Eustachian tube, right ear: Secondary | ICD-10-CM

## 2014-03-12 DIAGNOSIS — J069 Acute upper respiratory infection, unspecified: Secondary | ICD-10-CM

## 2014-03-12 MED ORDER — FLUTICASONE PROPIONATE 50 MCG/ACT NA SUSP: 1.0000 | Freq: Two times a day (BID) | NASAL | Status: DC

## 2014-03-12 NOTE — Progress Notes (Signed)
Subjective:     Hannah Garrett is a 48 y.o. female who presents for evaluation of symptoms of a URI, 1 week sore throat, sore throat, itchy & stuffy ears, dizzy, mild nausea, thick nasal drainage. She denies fever or cough. She feels a little better today. Treatment to date: OTC cold meds..  The following portions of the patient's history were reviewed and updated as appropriate: allergies, current medications, past medical history, past social history, past surgical history and problem list.  Review of Systems Pertinent items are noted in HPI.   Objective:    BP 119/83  Pulse 77  Temp(Src) 98 F (36.7 C) (Oral)  Ht 5' 3.5" (1.613 m)  Wt 186 lb 6.4 oz (84.55 kg)  BMI 32.50 kg/m2  SpO2 98% General appearance: alert, cooperative, appears stated age and no distress Head: Normocephalic, without obvious abnormality, atraumatic Eyes: negative findings: lids and lashes normal and conjunctivae and sclerae normal Ears: nml L TM, R TM retracted, bones visible Nose: scant thick white mucous bilat nares Throat: lips, mucosa, and tongue normal; teeth and gums normal and tonsils absent Lungs: clear to auscultation bilaterally Heart: regular rate and rhythm, S1, S2 normal, no murmur, click, rub or gallop Neurologic: Grossly normal  NEg Dix-Hallpike.  Assessment:  1. Acute upper respiratory infections of unspecified site - fluticasone (FLONASE) 50 MCG/ACT nasal spray; Place 1 spray into both nostrils every 12 (twelve) hours.  Dispense: 16 g; Refill: 6  2. Eustachian tube dysfunction, right  See pt inst. F/u PRN

## 2014-03-12 NOTE — Patient Instructions (Addendum)
You have fluid trapped in the R middle ear because the eustacian tube is not functioning properly. This can be due to viral or allergenic causes that make the sinuses swell. Start flonase twice daily for a  Minimum of 10 days, longer if you still feel ear is stopped up. Take pseudoephedrine 30 mg 2 to 3 times daily for 5 to 7 days. If it keeps you awake, skip last dose of day. Start daily sinus rinses (Neilmed sinus rinse). Use for 5 to 7 days, longer if not running clear.  Let us know if you are not feeling better or if you start to run fever.  Nice to see you.  Barotitis Media Barotitis media is inflammation of your middle ear. This occurs when the auditory tube (eustachian tube) leading from the back of your nose (nasopharynx) to your eardrum is blocked. This blockage may result from a cold, environmental allergies, or an upper respiratory infection. Unresolved barotitis media may lead to damage or hearing loss (barotrauma), which may become permanent. HOME CARE INSTRUCTIONS   Use medicines as recommended by your health care provider. Over-the-counter medicines will help unblock the canal and can help during times of air travel.  Do not put anything into your ears to clean or unplug them. Eardrops will not be helpful.  Do not swim, dive, or fly until your health care provider says it is all right to do so. If these activities are necessary, chewing gum with frequent, forceful swallowing may help. It is also helpful to hold your nose and gently blow to pop your ears for equalizing pressure changes. This forces air into the eustachian tube.  Only take over-the-counter or prescription medicines for pain, discomfort, or fever as directed by your health care provider.  A decongestant may be helpful in decongesting the middle ear and make pressure equalization easier. SEEK MEDICAL CARE IF:  You experience a serious form of dizziness in which you feel as if the room is spinning and you feel  nauseated (vertigo).  Your symptoms only involve one ear. SEEK IMMEDIATE MEDICAL CARE IF:   You develop a severe headache, dizziness, or severe ear pain.  You have bloody or pus-like drainage from your ears.  You develop a fever.  Your problems do not improve or become worse. MAKE SURE YOU:   Understand these instructions.  Will watch your condition.  Will get help right away if you are not doing well or get worse. Document Released: 05/28/2000 Document Revised: 03/21/2013 Document Reviewed: 12/26/2012 [facility] Patient Information 2015 [place], [place]. This information is not intended to replace advice given to you by your health care provider. Make sure you discuss any questions you have with your health care provider.

## 2014-03-12 NOTE — Progress Notes (Signed)
Pre visit review using our clinic review tool, if applicable. No additional management support is needed unless otherwise documented below in the visit note. 

## 2014-05-01 ENCOUNTER — Encounter: Payer: Self-pay | Admitting: Family Medicine

## 2014-05-01 ENCOUNTER — Ambulatory Visit (INDEPENDENT_AMBULATORY_CARE_PROVIDER_SITE_OTHER): Payer: BC Managed Care – PPO | Admitting: Family Medicine

## 2014-05-01 VITALS — BP 130/89 | HR 93 | Temp 99.4°F | Resp 18 | Ht 63.5 in | Wt 187.0 lb

## 2014-05-01 DIAGNOSIS — J45990 Exercise induced bronchospasm: Secondary | ICD-10-CM

## 2014-05-01 DIAGNOSIS — R350 Frequency of micturition: Secondary | ICD-10-CM

## 2014-05-01 DIAGNOSIS — J208 Acute bronchitis due to other specified organisms: Secondary | ICD-10-CM

## 2014-05-01 DIAGNOSIS — N3941 Urge incontinence: Secondary | ICD-10-CM

## 2014-05-01 DIAGNOSIS — J018 Other acute sinusitis: Secondary | ICD-10-CM

## 2014-05-01 LAB — POCT URINALYSIS DIPSTICK
Bilirubin, UA: NEGATIVE
Blood, UA: NEGATIVE
Glucose, UA: NEGATIVE
KETONES UA: NEGATIVE
Leukocytes, UA: NEGATIVE
Nitrite, UA: NEGATIVE
PH UA: 5
PROTEIN UA: NEGATIVE
Urobilinogen, UA: 0.2

## 2014-05-01 MED ORDER — PREDNISONE 20 MG PO TABS: ORAL_TABLET | ORAL | Status: DC

## 2014-05-01 MED ORDER — AMOXICILLIN 875 MG PO TABS: 875.0000 mg | ORAL_TABLET | Freq: Two times a day (BID) | ORAL | Status: AC

## 2014-05-01 MED ORDER — ALBUTEROL SULFATE HFA 108 (90 BASE) MCG/ACT IN AERS: 2.0000 | INHALATION_SPRAY | Freq: Four times a day (QID) | RESPIRATORY_TRACT | Status: DC | PRN

## 2014-05-01 MED ORDER — OXYBUTYNIN CHLORIDE ER 10 MG PO TB24: 10.0000 mg | ORAL_TABLET | Freq: Every day | ORAL | Status: DC

## 2014-05-01 NOTE — Progress Notes (Signed)
Pre visit review using our clinic review tool, if applicable. No additional management support is needed unless otherwise documented below in the visit note. 

## 2014-05-01 NOTE — Progress Notes (Signed)
OFFICE VISIT  05/01/2014   CC:  Chief Complaint  Patient presents with  . Nasal Congestion  . Ear Pain  . Urinary Frequency   HPI:    Patient is a 48 y.o. Caucasian female who presents for resp sx's and urinary frequency. About 1 yr of urinary urgency, some slight incontinence.  No dysuria, no gross hematuria, no lower abd or flank pain.  More than 4 wks of nasal congestion, runny nose, sinus HA, now starting to get cough.  Mild nausea with PND lately.  No fever.  No wheezing but occ SOB. +Current smoker.  Has an inhaler but doesn't know where it is. Tried sinus rinses and advil cold/sinus.  Past Medical History  Diagnosis Date  . Hyperlipidemia, mixed   . Arthritis   . IBS (irritable bowel syndrome)   . Fibromyalgia   . Asthma 10/20/2010    Much worse as a child, only prn albut as adult  . Depression 10/20/2010  . Right shoulder pain 10/20/2010  . Neck pain, chronic 10/20/2010  . Allergic rhinitis 10/21/2010  . Recurrent sinusitis 10/21/2010  . Irritable bowel syndrome (IBS) 10/21/2010  . BCC (basal cell carcinoma), face 10/21/2010  . Endometriosis 10/21/2010  . Urge incontinence 10/21/2010  . Plantar fasciitis 10/21/2010  . Tobacco abuse 10/21/2010  . Back pain 10/21/2010  . Headache, migraine 10/21/2010  . Headache, tension-type 10/21/2010  . 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 Overweight(278.02) 10/21/2010  . Fatigue 10/21/2010  . Peripheral edema 10/21/2010  . Insomnia 02/10/2011  . Adult ADHD 02/10/2011    Past Surgical History  Procedure Laterality Date  . Tonsillectomy and adenoidectomy  1974  . Right knee  1987  . Cesarean section  1988  . Dilation and curettage of uterus  1994 and 1995  . Left knee  2002  . Neck surgery  2003 & 2005  . Left shoulder  2004  . Abdominal hysterectomy  2004    for endometriosis  . Shock wave on right foot  2009  . Left foot surgery  2010  . Nose surgery  49 and 48 yrs old  . Sinus surgeries  2005 &2007    Outpatient Prescriptions Prior to Visit  Medication  Sig Dispense Refill  . albuterol (VENTOLIN HFA) 108 (90 BASE) MCG/ACT inhaler Inhale 2 puffs into the lungs every 6 (six) hours as needed for wheezing. 1 Inhaler 5  . meperidine (DEMEROL) 50 MG tablet Take 50 mg by mouth every 4 (four) hours as needed for severe pain.    . promethazine (PHENERGAN) 25 MG tablet Take 25 mg by mouth every 4 (four) hours as needed for nausea or vomiting.    Marland Kitchen atorvastatin (LIPITOR) 20 MG tablet Take 1 tablet (20 mg total) by mouth daily. 90 tablet 1  . fluticasone (FLONASE) 50 MCG/ACT nasal spray Place 1 spray into both nostrils every 12 (twelve) hours. 16 g 6  . lisdexamfetamine (VYVANSE) 40 MG capsule Take 1 capsule (40 mg total) by mouth every morning. 30 capsule 0   No facility-administered medications prior to visit.    Allergies  Allergen Reactions  . Codeine Hives  . Darvocet [Propoxyphene N-Acetaminophen] Nausea And Vomiting  . Percocet [Oxycodone-Acetaminophen] Nausea And Vomiting  . Hydrocodone Nausea And Vomiting    ROS As per HPI  PE: Blood pressure 130/89, pulse 93, temperature 99.4 F (37.4 C), temperature source Temporal, resp. rate 18, height 5' 3.5" (1.613 m), weight 187 lb (84.823 kg), SpO2 97 %. VS: noted--normal. Gen: alert, NAD, NONTOXIC  APPEARING. HEENT: eyes without injection, drainage, or swelling.  Ears: EACs clear, TMs with normal light reflex and landmarks.  Nose: Clear rhinorrhea, with some dried, crusty exudate adherent to mildly injected mucosa.  No purulent d/c.  Mild L>R paranasal sinus TTP.  No facial swelling.  Throat and mouth without focal lesion.  No pharyngial swelling, erythema, or exudate.   Neck: supple, no LAD.   LUNGS: CTA bilat, nonlabored resps.  Some post-exhalation coughing and mildly prolonged exp phase. CV: RRR, no m/r/g. EXT: no c/c/e SKIN: no rash  LABS:  CC UA today: trace intact blood  IMPRESSION AND PLAN:  1) Prolonged upper and lower resp tract infection, smoker: Amoxil 875mg  bid x  10d. Prednisone 40mg  qd x 5d. Ventolin HFA 2 p q6h prn RF. She has had her flu vaccine.  Mucinex DM + sinus rinses recommended.  2) Urge incontinence: send urine for c/s. Start ditropan xl 10mg  qhs.  An After Visit Summary was printed and given to the patient.  FOLLOW UP:  3 mo f/u urge incont

## 2014-05-03 LAB — URINE CULTURE
COLONY COUNT: NO GROWTH
Organism ID, Bacteria: NO GROWTH

## 2014-07-16 ENCOUNTER — Telehealth: Payer: Self-pay | Admitting: Family Medicine

## 2014-07-16 NOTE — Telephone Encounter (Signed)
Patient is having flu like symptoms. Can come in today for an appointment. Please contact patient.

## 2014-07-16 NOTE — Telephone Encounter (Signed)
Patient Name: Hannah Garrett  DOB: [birth year]    Initial Comment Caller states she has a cough. Body aches.    Nurse Assessment  Nurse: Wynetta Emery, RN, Baker Janus Date/Time Eilene Ghazi Time): 07/16/2014 9:00:11 AM  Confirm and document reason for call. If symptomatic, describe symptoms. ---[name] has a fever 101 at it's highest bad cough and body aches onset yesterday  Has the patient traveled out of the country within the last 30 days? ---No  Does the patient require triage? ---Yes  Related visit to physician within the last 2 weeks? ---No  Does the PT have any chronic conditions? (i.e. diabetes, asthma, etc.) ---Unknown  Did the patient indicate they were pregnant? ---No     Guidelines    Guideline Title Affirmed Question Affirmed Notes  Influenza - Seasonal Earache left earache sore throat (mild) headache body aches cough and fever   Final Disposition User   See Physician within [facility], RN, Baker Janus    Comments  Nurse attempted to make 1015 am appt while doing so office staff filled the appt; called office to see if we could have one of the slots MD has asked to call first Office personal will take care of appt if possible --Patient has flu like symptoms; came on suddenly cough, fever body aches headache sore throat.

## 2014-07-16 NOTE — Telephone Encounter (Signed)
Dr. Idelle Leech schedule is full.  I offered patient appointment with [facility] this afternoon.  Pt will CB.

## 2014-07-18 ENCOUNTER — Ambulatory Visit (INDEPENDENT_AMBULATORY_CARE_PROVIDER_SITE_OTHER): Payer: BC Managed Care – PPO | Admitting: Family Medicine

## 2014-07-18 ENCOUNTER — Encounter: Payer: Self-pay | Admitting: Family Medicine

## 2014-07-18 VITALS — BP 113/79 | HR 96 | Temp 98.2°F | Resp 18 | Ht 63.5 in | Wt 184.0 lb

## 2014-07-18 DIAGNOSIS — J209 Acute bronchitis, unspecified: Secondary | ICD-10-CM

## 2014-07-18 DIAGNOSIS — R69 Illness, unspecified: Principal | ICD-10-CM

## 2014-07-18 DIAGNOSIS — J111 Influenza due to unidentified influenza virus with other respiratory manifestations: Secondary | ICD-10-CM

## 2014-07-18 MED ORDER — PREDNISONE 20 MG PO TABS: ORAL_TABLET | ORAL | Status: DC

## 2014-07-18 MED ORDER — BENZONATATE 200 MG PO CAPS: ORAL_CAPSULE | ORAL | Status: DC

## 2014-07-18 NOTE — Progress Notes (Signed)
OFFICE NOTE  07/18/2014  CC:  Chief Complaint  Patient presents with  . Generalized Body Aches    x Monday  . Fever    102.3   . Headache   HPI: Patient is a 49 y.o. Caucasian female who is here for 4 d of malaise, coughing, achiness in body diffusely, HA, temp up to 102.3, appetite down.  No fever today.  Mucinex DM, nyquil, alleve all tried.  Bed rest.  Trying to hydrate. Started to get loose BMs today: has had 2 watery bm's.  +Nausea as well.  No abd pain.  Sore in back and neck from coughing so much lately.  She is a smoker.  Pertinent PMH:  Past medical, surgical, social, and family history reviewed and no changes are noted since last office visit.  MEDS:  Outpatient Prescriptions Prior to Visit  Medication Sig Dispense Refill  . albuterol (VENTOLIN HFA) 108 (90 BASE) MCG/ACT inhaler Inhale 2 puffs into the lungs every 6 (six) hours as needed for wheezing. 1 Inhaler 1  . meperidine (DEMEROL) 50 MG tablet Take 50 mg by mouth every 4 (four) hours as needed for severe pain.    Marland Kitchen oxybutynin (DITROPAN-XL) 10 MG 24 hr tablet Take 1 tablet (10 mg total) by mouth at bedtime. 30 tablet 6  . promethazine (PHENERGAN) 25 MG tablet Take 25 mg by mouth every 4 (four) hours as needed for nausea or vomiting.    Marland Kitchen atorvastatin (LIPITOR) 20 MG tablet Take 1 tablet (20 mg total) by mouth daily. (Patient not taking: Reported on 07/18/2014) 90 tablet 1  . lisdexamfetamine (VYVANSE) 40 MG capsule Take 1 capsule (40 mg total) by mouth every morning. (Patient not taking: Reported on 07/18/2014) 30 capsule 0  . predniSONE (DELTASONE) 20 MG tablet 2 tabs po qd x 5d 10 tablet 0   No facility-administered medications prior to visit.    PE: Blood pressure 113/79, pulse 96, temperature 98.2 F (36.8 C), temperature source Temporal, resp. rate 18, height 5' 3.5" (1.613 m), weight 184 lb (83.462 kg), SpO2 98 %. VS: noted--normal. Gen: alert, NAD, NONTOXIC APPEARING. HEENT: eyes without injection, drainage, or  swelling.  Ears: EACs clear, TMs with normal light reflex and landmarks.  Nose: Clear rhinorrhea, with some dried, crusty exudate adherent to mildly injected mucosa.  No purulent d/c.  No paranasal sinus TTP.  No facial swelling.  Throat and mouth without focal lesion.  No pharyngial swelling, erythema, or exudate.   Neck: supple, no LAD.   LUNGS: CTA bilat, nonlabored resps.  With forced expiration she has excessive coughing and some coarse wheezing but good aeration and normal length-exp phase.   CV: RRR, no m/r/g. EXT: no c/c/e SKIN: no rash    IMPRESSION AND PLAN:  Influenza-like illness--she is 4 days into this. She has acute asthmatic bronchitis component that I'll treat with prednisone 40mg  qd x 5d. Symptomatic care with mucinex DM daytime, tessalon perles hs. Contact precautions discussed. Out of work note given. Encouraged smoking cessation.  An After Visit Summary was printed and given to the patient.  FOLLOW UP: prn

## 2014-07-18 NOTE — Progress Notes (Signed)
Pre visit review using our clinic review tool, if applicable. No additional management support is needed unless otherwise documented below in the visit note. 

## 2014-07-23 MED ORDER — AZITHROMYCIN 250 MG PO TABS: ORAL_TABLET | ORAL | Status: DC

## 2014-07-23 NOTE — Telephone Encounter (Signed)
Patient aware Rx sent.  

## 2014-07-23 NOTE — Telephone Encounter (Signed)
No I won't call in an antibiotic. Pt must be seen before a med like this will be considered.-thx

## 2014-07-23 NOTE — Telephone Encounter (Signed)
Patient is feeling like she is developing a sinus infection, she feels like there is a lot congestion around her eyes and her ears. Can an Rx for an antibiotic be sent to [facility]?

## 2014-07-23 NOTE — Telephone Encounter (Signed)
Please advise 

## 2014-07-23 NOTE — Telephone Encounter (Signed)
Reviewed pt's chart. I saw her 07/18/14. Ok to send in antibiotic: pls eRx azithromycin 250mg  tabs, 2 tabs po qd x 1d, then 1 tab po qd x 4d, #6, no RF. Recommend OTC generic saline nasal spray to irrigate nasal passages several times per day.--thx

## 2014-07-30 ENCOUNTER — Ambulatory Visit (INDEPENDENT_AMBULATORY_CARE_PROVIDER_SITE_OTHER): Payer: BC Managed Care – PPO | Admitting: Nurse Practitioner

## 2014-07-30 ENCOUNTER — Encounter: Payer: Self-pay | Admitting: Nurse Practitioner

## 2014-07-30 VITALS — BP 120/80 | HR 81 | Temp 98.0°F | Ht 63.5 in | Wt 186.0 lb

## 2014-07-30 DIAGNOSIS — H6501 Acute serous otitis media, right ear: Secondary | ICD-10-CM

## 2014-07-30 MED ORDER — AMOXICILLIN-POT CLAVULANATE 875-125 MG PO TABS: 1.0000 | ORAL_TABLET | Freq: Two times a day (BID) | ORAL | Status: DC

## 2014-07-30 NOTE — Patient Instructions (Signed)
Start antibiotic. Eat yogurt daily to help prevent antibiotic -associated diarrhea. Continue to use mechanical method to facilitate ear drainage. Use ibuprophen for pain if needed.  Otitis Media Otitis media is redness, soreness, and inflammation of the middle ear. Otitis media may be caused by allergies or, most commonly, by infection. Often it occurs as a complication of the common cold. SIGNS AND SYMPTOMS Symptoms of otitis media may include:  Earache.  Fever.  Ringing in your ear.  Headache.  Leakage of fluid from the ear. DIAGNOSIS To diagnose otitis media, your health care provider will examine your ear with an otoscope. This is an instrument that allows your health care provider to see into your ear in order to examine your eardrum. Your health care provider also will ask you questions about your symptoms. TREATMENT  Typically, otitis media resolves on its own within 3-5 days. Your health care provider may prescribe medicine to ease your symptoms of pain. If otitis media does not resolve within 5 days or is recurrent, your health care provider may prescribe antibiotic medicines if he or she suspects that a bacterial infection is the cause. HOME CARE INSTRUCTIONS   If you were prescribed an antibiotic medicine, finish it all even if you start to feel better.  Take medicines only as directed by your health care provider.  Keep all follow-up visits as directed by your health care provider. SEEK MEDICAL CARE IF:  You have otitis media only in one ear, or bleeding from your nose, or both.  You notice a lump on your neck.  You are not getting better in 3-5 days.  You feel worse instead of better. SEEK IMMEDIATE MEDICAL CARE IF:   You have pain that is not controlled with medicine.  You have swelling, redness, or pain around your ear or stiffness in your neck.  You notice that part of your face is paralyzed.  You notice that the bone behind your ear (mastoid) is tender  when you touch it. MAKE SURE YOU:   Understand these instructions.  Will watch your condition.  Will get help right away if you are not doing well or get worse. Document Released: 03/05/2004 Document Revised: 10/15/2013 Document Reviewed: 12/26/2012 [facility] Patient Information 2015 [place], [place]. This information is not intended to replace advice given to you by your health care provider. Make sure you discuss any questions you have with your health care provider.

## 2014-07-30 NOTE — Progress Notes (Signed)
Pre visit review using our clinic review tool, if applicable. No additional management support is needed unless otherwise documented below in the visit note. 

## 2014-07-30 NOTE — Progress Notes (Signed)
   Subjective:    Patient ID: Hannah Garrett, female    DOB: 21-Apr-1966, 49 y.o.   MRN: [id number]  Otalgia  There is pain in the right ear. This is a new problem. The current episode started in the past 7 days. The problem occurs constantly. The problem has been gradually worsening. There has been no fever. The pain is mild. Associated symptoms include coughing. Associated symptoms comments: Recent URI, flu-like illness. . Treatments tried: prednisone, benzonatate capsule, azithromycin. The treatment provided mild relief.      Review of Systems  Constitutional: Negative for fever and fatigue.  HENT: Positive for congestion, ear pain and postnasal drip.   Respiratory: Positive for cough. Negative for chest tightness, shortness of breath and wheezing.        Objective:   Physical Exam  Constitutional: She is oriented to person, place, and time. She appears well-developed and well-nourished. No distress.  HENT:  Head: Normocephalic and atraumatic.  Right Ear: External ear normal.  Left Ear: External ear normal.  Mouth/Throat: Oropharynx is clear and moist. No oropharyngeal exudate.  R TM erythematous, unable to visualize bones  Eyes: Conjunctivae are normal. Right eye exhibits no discharge. Left eye exhibits no discharge.  Neck: Normal range of motion. Neck supple. No thyromegaly present.  Cardiovascular: Normal rate, regular rhythm and normal heart sounds.   No murmur heard. Pulmonary/Chest: Effort normal and breath sounds normal. No respiratory distress. She has no wheezes. She has no rales.  Lymphadenopathy:    She has no cervical adenopathy.  Neurological: She is alert and oriented to person, place, and time.  Skin: Skin is warm and dry.  Psychiatric: She has a normal mood and affect. Her behavior is normal. Thought content normal.  Vitals reviewed.         Assessment & Plan:  1. Right acute serous otitis media, recurrence not specified Recent URI w/fever, lingering  cough all other symptoms resolved - amoxicillin-clavulanate (AUGMENTIN) 875-125 MG per tablet; Take 1 tablet by mouth 2 (two) times daily.  Dispense: 14 tablet; Refill: 0 F/u PRN

## 2014-08-01 ENCOUNTER — Encounter: Payer: Self-pay | Admitting: Family Medicine

## 2015-01-03 ENCOUNTER — Encounter: Payer: Self-pay | Admitting: Family Medicine

## 2015-01-03 ENCOUNTER — Ambulatory Visit (INDEPENDENT_AMBULATORY_CARE_PROVIDER_SITE_OTHER): Payer: BC Managed Care – PPO | Admitting: Family Medicine

## 2015-01-03 ENCOUNTER — Other Ambulatory Visit: Payer: Self-pay | Admitting: Family Medicine

## 2015-01-03 VITALS — BP 114/77 | HR 75 | Temp 97.8°F | Resp 16 | Ht 63.5 in | Wt 188.0 lb

## 2015-01-03 DIAGNOSIS — Z0000 Encounter for general adult medical examination without abnormal findings: Secondary | ICD-10-CM | POA: Diagnosis not present

## 2015-01-03 DIAGNOSIS — H8110 Benign paroxysmal vertigo, unspecified ear: Secondary | ICD-10-CM

## 2015-01-03 DIAGNOSIS — M797 Fibromyalgia: Secondary | ICD-10-CM

## 2015-01-03 LAB — CBC WITH DIFFERENTIAL/PLATELET
Basophils Absolute: 0 10*3/uL (ref 0.0–0.1)
Basophils Relative: 0.5 % (ref 0.0–3.0)
EOS ABS: 0.3 10*3/uL (ref 0.0–0.7)
EOS PCT: 3 % (ref 0.0–5.0)
HCT: 43.3 % (ref 36.0–46.0)
HEMOGLOBIN: 14.4 g/dL (ref 12.0–15.0)
Lymphocytes Relative: 38.8 % (ref 12.0–46.0)
Lymphs Abs: 3.5 10*3/uL (ref 0.7–4.0)
MCHC: 33.3 g/dL (ref 30.0–36.0)
MCV: 88.4 fl (ref 78.0–100.0)
MONO ABS: 0.5 10*3/uL (ref 0.1–1.0)
MONOS PCT: 5.6 % (ref 3.0–12.0)
NEUTROS ABS: 4.8 10*3/uL (ref 1.4–7.7)
Neutrophils Relative %: 52.1 % (ref 43.0–77.0)
Platelets: 290 10*3/uL (ref 150.0–400.0)
RBC: 4.9 Mil/uL (ref 3.87–5.11)
RDW: 14 % (ref 11.5–15.5)
WBC: 9.1 10*3/uL (ref 4.0–10.5)

## 2015-01-03 LAB — TSH: TSH: 2.69 u[IU]/mL (ref 0.35–4.50)

## 2015-01-03 LAB — COMPREHENSIVE METABOLIC PANEL
ALK PHOS: 78 U/L (ref 39–117)
ALT: 13 U/L (ref 0–35)
AST: 16 U/L (ref 0–37)
Albumin: 4.1 g/dL (ref 3.5–5.2)
BILIRUBIN TOTAL: 0.4 mg/dL (ref 0.2–1.2)
BUN: 10 mg/dL (ref 6–23)
CO2: 26 mEq/L (ref 19–32)
Calcium: 9 mg/dL (ref 8.4–10.5)
Chloride: 105 mEq/L (ref 96–112)
Creatinine, Ser: 0.64 mg/dL (ref 0.40–1.20)
GFR: 104.55 mL/min (ref >60.00)
Glucose, Bld: 88 mg/dL (ref 70–99)
POTASSIUM: 4.1 meq/L (ref 3.5–5.1)
Sodium: 138 mEq/L (ref 135–145)
Total Protein: 6.6 g/dL (ref 6.0–8.3)

## 2015-01-03 LAB — LIPID PANEL
CHOL/HDL RATIO: 8
Cholesterol: 254 mg/dL — ABNORMAL HIGH (ref 0–200)
HDL: 33.6 mg/dL — AB (ref >39.00)
Triglycerides: 604 mg/dL — ABNORMAL HIGH (ref 0.0–149.0)

## 2015-01-03 LAB — LDL CHOLESTEROL, DIRECT: Direct LDL: 118 mg/dL

## 2015-01-03 MED ORDER — DESVENLAFAXINE SUCCINATE ER 50 MG PO TB24: 50.0000 mg | ORAL_TABLET | Freq: Every day | ORAL | Status: DC

## 2015-01-03 MED ORDER — GEMFIBROZIL 600 MG PO TABS: 600.0000 mg | ORAL_TABLET | Freq: Two times a day (BID) | ORAL | Status: DC

## 2015-01-03 NOTE — Progress Notes (Signed)
Pre visit review using our clinic review tool, if applicable. No additional management support is needed unless otherwise documented below in the visit note. 

## 2015-01-03 NOTE — Progress Notes (Signed)
OFFICE VISIT  01/05/2015   CC:  Chief Complaint  Patient presents with  . Joint Pain  . URI  . Insect Bite     HPI:    Patient is a 49 y.o. Caucasian female who presents for joint aches. Reports aching/swelling in hands lately, both elbows and both knees.  Says both ankles hurt/feet hurt.  Hurts in entire back from tops of shouders down.  Arms and legs ache all over.  Says she has tried and failed cymbalta, lyrica, gabapentin, effexor for her fibromyalgia. Going on for over a year now, severe yesterday.  Dr. Percell Miller has rx'd her demerol for her knee (left) b/c of severe DJD, but she takes this only on rare occasion. She was supposed to have had a TKR.  Has hx of BPPV, sounds like she's been having a bit more intermittently over last couple months.  Has not been doing any epley's maneuvers, doesn't know what these are.  ROS: no fevers, no joint swelling or erythema or warmth.  +fatigue.   Past Medical History  Diagnosis Date  . Hyperlipidemia, mixed     Trigs >600  . Arthritis   . IBS (irritable bowel syndrome)   . Fibromyalgia   . Asthma 10/20/2010    Much worse as a child, only prn albut as adult  . Depression 10/20/2010  . Right shoulder pain 10/20/2010  . Neck pain, chronic 10/20/2010  . Allergic rhinitis 10/21/2010  . Recurrent sinusitis 10/21/2010  . Irritable bowel syndrome (IBS) 10/21/2010  . BCC (basal cell carcinoma), face 10/21/2010  . Endometriosis 10/21/2010  . Urge incontinence 10/21/2010  . Plantar fasciitis 10/21/2010  . Tobacco abuse 10/21/2010  . Back pain 10/21/2010  . Headache, migraine 10/21/2010  . Headache, tension-type 10/21/2010  . 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 Overweight(278.02) 10/21/2010  . Fatigue 10/21/2010  . Peripheral edema 10/21/2010  . Insomnia 02/10/2011  . Adult ADHD 02/10/2011    Past Surgical History  Procedure Laterality Date  . Tonsillectomy and adenoidectomy  1974  . Right knee  1987  . Cesarean section  1988  . Dilation and curettage of uterus  1994 and 1995   . Left knee  2002  . Neck surgery  2003 & 2005  . Left shoulder  2004  . Abdominal hysterectomy  2004    for endometriosis  . Shock wave on right foot  2009  . Left foot surgery  2010  . Nose surgery  50 and 49 yrs old  . Sinus surgeries  2005 &2007    Outpatient Prescriptions Prior to Visit  Medication Sig Dispense Refill  . albuterol (VENTOLIN HFA) 108 (90 BASE) MCG/ACT inhaler Inhale 2 puffs into the lungs every 6 (six) hours as needed for wheezing. 1 Inhaler 1  . meperidine (DEMEROL) 50 MG tablet Take 50 mg by mouth every 4 (four) hours as needed for severe pain.    . Probiotic Product (PROBIOTIC PO) Take by mouth daily.    . promethazine (PHENERGAN) 25 MG tablet Take 25 mg by mouth every 4 (four) hours as needed for nausea or vomiting.    Marland Kitchen atorvastatin (LIPITOR) 20 MG tablet Take 1 tablet (20 mg total) by mouth daily. (Patient not taking: Reported on 01/03/2015) 90 tablet 1  . lisdexamfetamine (VYVANSE) 40 MG capsule Take 1 capsule (40 mg total) by mouth every morning. (Patient not taking: Reported on 01/03/2015) 30 capsule 0  . oxybutynin (DITROPAN-XL) 10 MG 24 hr tablet Take 1 tablet (10 mg total) by mouth  at bedtime. 30 tablet 6  . amoxicillin-clavulanate (AUGMENTIN) 875-125 MG per tablet Take 1 tablet by mouth 2 (two) times daily. (Patient not taking: Reported on 01/03/2015) 14 tablet 0   No facility-administered medications prior to visit.    Allergies  Allergen Reactions  . Codeine Hives  . Darvocet [Propoxyphene N-Acetaminophen] Nausea And Vomiting  . Percocet [Oxycodone-Acetaminophen] Nausea And Vomiting  . Hydrocodone Nausea And Vomiting    ROS As per HPI  PE: Blood pressure 114/77, pulse 75, temperature 97.8 F (36.6 C), temperature source Oral, resp. rate 16, height 5' 3.5" (1.613 m), weight 188 lb (85.276 kg), SpO2 97 %. VS: noted--normal. Gen: alert, NAD, WELL- APPEARING. HEENT: eyes without injection, drainage, or swelling.  Ears: EACs clear, TMs with  normal light reflex and landmarks.  Nose: Clear rhinorrhea, with some dried, crusty exudate adherent to mildly injected mucosa.  No purulent d/c.  No paranasal sinus TTP.  No facial swelling.  Throat and mouth without focal lesion.  No pharyngial swelling, erythema, or exudate.   Neck: supple, no LAD.   LUNGS: CTA bilat, nonlabored resps.   CV: RRR, no m/r/g. EXT: no c/c/e SKIN: no rash MUSCULOSKELETAL: she is tender EVERYWHERE--essentially every soft tissue region of her body.     LABS:  Lab Results  Component Value Date   TSH 2.69 01/03/2015   Lab Results  Component Value Date   WBC 9.1 01/03/2015   HGB 14.4 01/03/2015   HCT 43.3 01/03/2015   MCV 88.4 01/03/2015   PLT 290.0 01/03/2015   Lab Results  Component Value Date   CREATININE 0.64 01/03/2015   BUN 10 01/03/2015   NA 138 01/03/2015   K 4.1 01/03/2015   CL 105 01/03/2015   CO2 26 01/03/2015   Lab Results  Component Value Date   ALT 13 01/03/2015   AST 16 01/03/2015   ALKPHOS 78 01/03/2015   BILITOT 0.4 01/03/2015   Lab Results  Component Value Date   CHOL 254* 01/03/2015   Lab Results  Component Value Date   HDL 33.60* 01/03/2015   No results found for: [facility] Lab Results  Component Value Date   TRIG * 01/03/2015    604.0 Triglyceride is over 400; calculations on Lipids are invalid.   Lab Results  Component Value Date   CHOLHDL 8 01/03/2015   IMPRESSION AND PLAN:  1) Fibromyalgia syndrome, acute flare. She has failed several meds for this condition (see HPI): will do trial of pristiq 50mg  qd and plan on titrating up in future prn.  T TENS unit rx given to pt.  2) BPPV: Home epley maneuvers handout reviewed with pt and given to her.  3) Preventative health care: fasting HP labs done in prep for health maintenance exam, which we'll do at recheck in 1 mo.  An After Visit Summary was printed and given to the patient.  FOLLOW UP: Return in about 4 weeks (around 01/31/2015) for annual CPE  (fasting not necessary).

## 2015-01-20 ENCOUNTER — Telehealth: Payer: Self-pay | Admitting: *Deleted

## 2015-01-20 NOTE — Telephone Encounter (Signed)
Spoke to Phillis with [employer] and she stated that the preferred medications for depression are Venlafaxine, sertraline and duloxetine. I advised her that we were not prescribing Pristiq for depression we were prescribing it to treat pts fibromyalgia. She stated that we have two options since PA has been denied we can either have pt try one of the preferred medications or send a letter of medical necessity to [address] [place] [id number] Attn: Beverely Low and Grievance. She did state the question on PA asking if this medication was being prescribed for depression was answered NO. Please advise. Thanks.

## 2015-01-23 MED ORDER — MILNACIPRAN HCL 12.5 & 25 & 50 MG PO MISC: ORAL | Status: DC

## 2015-01-23 NOTE — Telephone Encounter (Signed)
Not doing letter. I will try a different med for fibromyalgia. I have sent in [facility] for pt to take as instructed on the titration pack. Have her f/u with me in 1 mo.-thx

## 2015-01-23 NOTE — Telephone Encounter (Signed)
Pt advised and voiced understanding. Apt for 02/03/15 cancelled and new apt made for 02/21/15 at 3:30pm.

## 2015-02-03 ENCOUNTER — Encounter: Payer: BC Managed Care – PPO | Admitting: Family Medicine

## 2015-02-21 ENCOUNTER — Ambulatory Visit (INDEPENDENT_AMBULATORY_CARE_PROVIDER_SITE_OTHER): Payer: BC Managed Care – PPO | Admitting: Family Medicine

## 2015-02-21 ENCOUNTER — Encounter: Payer: Self-pay | Admitting: Family Medicine

## 2015-02-21 VITALS — BP 126/84 | HR 87 | Temp 98.0°F | Resp 16 | Ht 63.5 in | Wt 184.0 lb

## 2015-02-21 DIAGNOSIS — M797 Fibromyalgia: Secondary | ICD-10-CM | POA: Diagnosis not present

## 2015-02-21 DIAGNOSIS — Z23 Encounter for immunization: Secondary | ICD-10-CM

## 2015-02-21 DIAGNOSIS — F909 Attention-deficit hyperactivity disorder, unspecified type: Secondary | ICD-10-CM

## 2015-02-21 DIAGNOSIS — E781 Pure hyperglyceridemia: Secondary | ICD-10-CM

## 2015-02-21 DIAGNOSIS — F900 Attention-deficit hyperactivity disorder, predominantly inattentive type: Secondary | ICD-10-CM | POA: Diagnosis not present

## 2015-02-21 MED ORDER — BUDESONIDE 32 MCG/ACT NA SUSP: 2.0000 | Freq: Every day | NASAL | Status: DC

## 2015-02-21 MED ORDER — METHYLPHENIDATE HCL ER (OSM) 27 MG PO TBCR: 27.0000 mg | EXTENDED_RELEASE_TABLET | ORAL | Status: DC

## 2015-02-21 MED ORDER — AMOXICILLIN 875 MG PO TABS: 875.0000 mg | ORAL_TABLET | Freq: Two times a day (BID) | ORAL | Status: DC

## 2015-02-21 NOTE — Progress Notes (Signed)
Pre visit review using our clinic review tool, if applicable. No additional management support is needed unless otherwise documented below in the visit note. 

## 2015-02-21 NOTE — Progress Notes (Signed)
OFFICE VISIT  02/21/2015   CC:  Chief Complaint  Patient presents with  . Nasal Congestion  . Nevus    on chest   HPI:    Patient is a 49 y.o. Caucasian female who presents for f/u adult ADHD, fibromyalgia, and hypertriglyceridemia. Unable to afford the savella or pristiq for her fibromyalgia.  Says "I'll just deal with it, I have for years".  Taking gemfibrozil since last visit for triglycerides > 600-no side effects.  Focus and concentration difficult chronically, loses her "train of thought" easily, gets overwhelmed easy with multi-tasking at work.  Vyvanse trial no help in the past.   Has no prob initiating sleep but wakes up several times per night. Currently feeling depressed due to relationship problem with her husband lately, stressful work lately.  Has had nasal/sinus congestion lately x months- constant, but nothing comes out with blowing.  Sometimes has some PND.  Flonase trial in past burned throat.  Hx of afrin overuse in the past but none recently. Does saline rinses and this brings mild relief short term.  Some mild ST from mouth breathing, but no cough and no fevers.  No excessive sneezing.  Ears feel clogged/popping.   Saw Dr. Redmond Baseman in the remote past for sinus polyps and subsequent surgeries.  Past Medical History  Diagnosis Date  . Hyperlipidemia, mixed     Trigs >600  . Arthritis   . IBS (irritable bowel syndrome)   . Fibromyalgia   . Asthma 10/20/2010    Much worse as a child, only prn albut as adult  . Depression 10/20/2010  . Right shoulder pain 10/20/2010  . Neck pain, chronic 10/20/2010  . Allergic rhinitis 10/21/2010  . Recurrent sinusitis 10/21/2010  . Irritable bowel syndrome (IBS) 10/21/2010  . BCC (basal cell carcinoma), face 10/21/2010  . Endometriosis 10/21/2010  . Urge incontinence 10/21/2010  . Plantar fasciitis 10/21/2010  . Tobacco abuse 10/21/2010  . Back pain 10/21/2010  . Headache, migraine 10/21/2010  . Headache, tension-type 10/21/2010  . 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 Overweight(278.02) 10/21/2010  . Fatigue 10/21/2010  . Peripheral edema 10/21/2010  . Insomnia 02/10/2011  . Adult ADHD 02/10/2011    Past Surgical History  Procedure Laterality Date  . Tonsillectomy and adenoidectomy  1974  . Right knee  1987  . Cesarean section  1988  . Dilation and curettage of uterus  1994 and 1995  . Left knee  2002  . Neck surgery  2003 & 2005  . Left shoulder  2004  . Abdominal hysterectomy  2004    for endometriosis  . Shock wave on right foot  2009  . Left foot surgery  2010  . Nose surgery  81 and 49 yrs old  . Sinus surgeries  2005 &2007   MEDS: not taking lipitor, phenergan, pristiq, savella, oxybutynin, or vyvanse listed below Outpatient Prescriptions Prior to Visit  Medication Sig Dispense Refill  . albuterol (VENTOLIN HFA) 108 (90 BASE) MCG/ACT inhaler Inhale 2 puffs into the lungs every 6 (six) hours as needed for wheezing. 1 Inhaler 1  . atorvastatin (LIPITOR) 20 MG tablet Take 1 tablet (20 mg total) by mouth daily. 90 tablet 1  . meperidine (DEMEROL) 50 MG tablet Take 50 mg by mouth every 4 (four) hours as needed for severe pain.    . Probiotic Product (PROBIOTIC PO) Take by mouth daily.    . promethazine (PHENERGAN) 25 MG tablet Take 25 mg by mouth every 4 (four) hours as needed for  nausea or vomiting.    Marland Kitchen desvenlafaxine (PRISTIQ) 50 MG 24 hr tablet Take 1 tablet (50 mg total) by mouth daily. (Patient not taking: Reported on 02/21/2015) 30 tablet 1  . gemfibrozil (LOPID) 600 MG tablet Take 1 tablet (600 mg total) by mouth 2 (two) times daily before a meal. (Patient not taking: Reported on 02/21/2015) 60 tablet 3  . lisdexamfetamine (VYVANSE) 40 MG capsule Take 1 capsule (40 mg total) by mouth every morning. (Patient not taking: Reported on 01/03/2015) 30 capsule 0  . Milnacipran HCl (SAVELLA TITRATION PACK) 12.5 & 25 & 50 MG MISC Take as instructed on titration pack (Patient not taking: Reported on 02/21/2015) 55 each 0  . oxybutynin (DITROPAN-XL)  10 MG 24 hr tablet Take 1 tablet (10 mg total) by mouth at bedtime. (Patient not taking: Reported on 02/21/2015) 30 tablet 6   No facility-administered medications prior to visit.    Allergies  Allergen Reactions  . Codeine Hives  . Darvocet [Propoxyphene N-Acetaminophen] Nausea And Vomiting  . Percocet [Oxycodone-Acetaminophen] Nausea And Vomiting  . Hydrocodone Nausea And Vomiting    ROS As per HPI  PE: Blood pressure 126/84, pulse 87, temperature 98 F (36.7 C), temperature source Oral, resp. rate 16, height 5' 3.5" (1.613 m), weight 184 lb (83.462 kg), SpO2 98 %. Gen: Alert, well appearing.  Patient is oriented to person, place, time, and situation. VS: noted--normal. Gen: alert, NAD, NONTOXIC APPEARING. HEENT: eyes without injection, drainage, or swelling.  Ears: EACs clear, TMs with normal light reflex and landmarks.  Nose: No rhinorrhea.  Some dried, crusty exudate adherent to mildly injected mucosa.  No purulent d/c.  Mild bilat paranasal sinus TTP.  No facial swelling.  Throat and mouth without focal lesion.  No pharyngial swelling, erythema, or exudate.   Neck: supple, no LAD.   LUNGS: CTA bilat, nonlabored resps.   CV: RRR, no m/r/g. EXT: no c/c/e SKIN: no rash  LABS:    Chemistry      Component Value Date/Time   NA 138 01/03/2015 0844   K 4.1 01/03/2015 0844   CL 105 01/03/2015 0844   CO2 26 01/03/2015 0844   BUN 10 01/03/2015 0844   CREATININE 0.64 01/03/2015 0844   CREATININE 0.85 02/16/2012 1612      Component Value Date/Time   CALCIUM 9.0 01/03/2015 0844   ALKPHOS 78 01/03/2015 0844   AST 16 01/03/2015 0844   ALT 13 01/03/2015 0844   BILITOT 0.4 01/03/2015 0844     Lab Results  Component Value Date   WBC 9.1 01/03/2015   HGB 14.4 01/03/2015   HCT 43.3 01/03/2015   MCV 88.4 01/03/2015   PLT 290.0 01/03/2015   Lab Results  Component Value Date   TSH 2.69 01/03/2015   Lab Results  Component Value Date   CHOL 254* 01/03/2015   HDL 33.60*  01/03/2015   LDLDIRECT 118.0 01/03/2015   TRIG * 01/03/2015    604.0 Triglyceride is over 400; calculations on Lipids are invalid.   CHOLHDL 8 01/03/2015   IMPRESSION AND PLAN:  1) Hypertriglyceridemia: trigs > 600 last check. On gemfibrozil for last 6 wks approx. Return for fasting lab visit to recheck FLP, continue gemfibrozil 600 mg bid.  2) Fibromyalgia syndrome: pt has not responded to any med trials,and the meds she has not tried she has been unable to afford or they have not been approved by her insurance.  She'll continue off of meds for this at this time.  3) Chronic  sinus discomfort/nasal congestion, pt with hx of sinus polyp dz and past sinus surgeries:  Will do trial of rhinocort AQUA and will also do 10d of amoxil 875mg  bid.  If not signif improved in 2 wks then she'll call and request re-referral to her ENT, Dr. Redmond Baseman.  4) Adult ADHD: vyvanse no help.  Will do trial of concerta, start at 27mg  qd dosing x 68mo and recheck in office in 1 mo.  Therapeutic expectations and side effect profile of medication discussed today.  Patient's questions answered.  An After Visit Summary was printed and given to the patient.   FOLLOW UP: No Follow-up on file.

## 2015-03-28 ENCOUNTER — Ambulatory Visit: Payer: BC Managed Care – PPO | Admitting: Family Medicine

## 2015-05-19 ENCOUNTER — Ambulatory Visit (INDEPENDENT_AMBULATORY_CARE_PROVIDER_SITE_OTHER): Payer: BC Managed Care – PPO | Admitting: Family Medicine

## 2015-05-19 ENCOUNTER — Encounter: Payer: Self-pay | Admitting: Family Medicine

## 2015-05-19 VITALS — BP 131/85 | HR 81 | Temp 98.3°F | Resp 20 | Wt 185.5 lb

## 2015-05-19 DIAGNOSIS — Z23 Encounter for immunization: Secondary | ICD-10-CM | POA: Diagnosis not present

## 2015-05-19 DIAGNOSIS — L03012 Cellulitis of left finger: Secondary | ICD-10-CM | POA: Diagnosis not present

## 2015-05-19 DIAGNOSIS — S6992XA Unspecified injury of left wrist, hand and finger(s), initial encounter: Secondary | ICD-10-CM | POA: Diagnosis not present

## 2015-05-19 MED ORDER — CEFTRIAXONE SODIUM 1 G IJ SOLR
1.0000 g | INTRAMUSCULAR | Status: AC
  Administered 2015-05-19: 1 g via INTRAMUSCULAR

## 2015-05-19 NOTE — Progress Notes (Signed)
Subjective:    Patient ID: Hannah Garrett, female    DOB: 09/16/1965, 49 y.o.   MRN: [id number]  HPI   Hand Laceration, left: Pt states yesterday she was attempting to cut her dog's collar with a pair of scissors and accidentally clipped her thumb in 2 locations on the palm side of her thumb. She states it started bleeding rather bad and she tried to  put pressure over the areas and it did not stop. She then placed super glue in the wounds to stop the bleeding and that was affective. She reports her thumb started swelling last night and this morning she noticed increased swelling and redness  in her hand. She reports she is unable to bend her thumb and it feels "numb". Pt is not a diabetic. She is an everyday smoker. She does not recall when her last Tetanus vac was, definitely  greater than 5 years. She is not allergic to any antibiotics.   Every day smoker  Past Medical History  Diagnosis Date  . Hyperlipidemia, mixed     Trigs >600  . Arthritis   . IBS (irritable bowel syndrome)   . Fibromyalgia   . Asthma 10/20/2010    Much worse as a child, only prn albut as adult  . Depression 10/20/2010  . Right shoulder pain 10/20/2010  . Neck pain, chronic 10/20/2010  . Allergic rhinitis 10/21/2010  . Recurrent sinusitis 10/21/2010  . Irritable bowel syndrome (IBS) 10/21/2010  . BCC (basal cell carcinoma), face 10/21/2010  . Endometriosis 10/21/2010  . Urge incontinence 10/21/2010  . Plantar fasciitis 10/21/2010  . Tobacco abuse 10/21/2010  . Back pain 10/21/2010  . Headache, migraine 10/21/2010  . Headache, tension-type 10/21/2010  . 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 Overweight(278.02) 10/21/2010  . Fatigue 10/21/2010  . Peripheral edema 10/21/2010  . Insomnia 02/10/2011  . Adult ADHD 02/10/2011   Allergies  Allergen Reactions  . Codeine Hives  . Darvocet [Propoxyphene N-Acetaminophen] Nausea And Vomiting  . Percocet [Oxycodone-Acetaminophen] Nausea And Vomiting  . Hydrocodone Nausea And Vomiting    Review of  Systems Negative, with the exception of above mentioned in HPI     Objective:   Physical Exam BP 131/85 mmHg  Pulse 81  Temp(Src) 98.3 F (36.8 C) (Oral)  Resp 20  Wt 185 lb 8 oz (84.142 kg)  SpO2 95% Gen: Afebrile. No acute distress. Nontoxic in appearance. Well developed, well nourished. Caucasian female. Pleasant.  CV: RRR  Skin/msk: No rashes, purpura or petechiae. Erythema left thumb and extending to palm. Soft tissue swelling left thumb from base to tip, palmar swelling and dorsal hand mild swelling. Small 2 mm skin laceration palmer aspect left thumb near PIP, 1 mm laceration palmer surface, DIP left thumb (both covered with super glue). Tingling sensation entire thumb. Unable to oppose thumb to fingers or flex thumb secondary to pain. Good cap refill to distal thumb.  Neuro: PERLA. EOMi. Alert. Oriented x3.     Assessment & Plan:  1. Need for prophylactic vaccination with combined diphtheria-tetanus-pertussis (DTP) vaccine - Tdap administered today secondary to laceration with scissors.  - Tdap vaccine greater than or equal to 7yo IM  2. Cellulitis of finger of left hand - Concern for compartment syndrome vs infectious tenosynovitis with exam of decreased ROM, swelling, redness and tingling sensation. . Pt reports nausea,suggesting mild systemic infection. Pt is not a diabetic or immunocompromised.  - will treat with IM 1 g Ceftriaxone now, emergent referral placed to [facility] (prior  pt) orthopedics who will see her today at 3:30. tdap administered today - cefTRIAXone (ROCEPHIN) injection 1 g; Inject 1 g into the muscle daily. - smoking cessation advised. - AVS on today's encounter and plan. Information on infectious tenosynovitis given as well.

## 2015-05-19 NOTE — Addendum Note (Signed)
Addended by: Howard Pouch A on: 05/19/2015 03:22 PM   Modules accepted: Orders

## 2015-05-19 NOTE — Patient Instructions (Signed)
[facility] will see today at 3:30. Concern for infectious tenosynovitis vs compartment syndrome with numbness. Ceftriaxone 1 g IM in office today.  Tdap in office today.   Infectious Finger Tenosynovitis Tendons are strong bands of tissue that connect muscles to bones. Infectious finger tenosynovitis is an infection of a tendon in a finger and of the layer of tissue that surrounds the tendon (sheath). This condition can quickly damage the tendon and the sheath. The infection can spread to the wrist, arm, and blood. CAUSES This condition is caused by bacteria that get into the tendon and the sheath through a puncture wound. Puncture wounds are commonly caused by a bite or a sharp object, such as a nail. RISK FACTORS This condition is more likely to be severe in:  People who have diabetes.  People who use illegal IV drugs.  People who have any condition that decreases blood supply to the hand. SYMPTOMS Symptoms of this condition include:  A curled finger.  Pain in a finger when it is straightened.  Swelling and tenderness along the length of a finger.  Finger redness.  Fever. DIAGNOSIS  This condition may be diagnosed with a physical exam and tests, such as:  A culture and sensitivity test. The test is done to determine which type of bacteria is causing the infection. It involves using a needle to remove a sample of pus or fluid from the finger.  A blood test. This is done to see if the infection is spreading through the blood.  An X-ray of the finger. This is done to see if there is an object inside the finger. TREATMENT This condition may be treated with:  Antibiotic medicines. Most people get these medicines through an IV tube at a hospital. Once the infection is under control, the medicines may be taken by mouth at home.  Surgery. A surgery called incision and drainage may be done to flush out (irrigate) the sheath. You may need this surgery if your infection does not  get better within 24 hours or if your treatment did not start within 48 hours of your infection. Sometimes an additional procedure is needed to remove dead tissue or an object (foreign body) from the finger.  A splint. You may have to wear a splint on your finger to prevent pain and movement. This condition is a medical emergency. Treatment must be started as soon as possible to keep the infection from spreading and causing permanent damage. Treatment is usually started in a hospital. HOME CARE INSTRUCTIONS Medicines  Take over-the-counter and prescription medicines only as told by your health care provider.  Take your antibiotic medicine as told by your health care provider. Do not stop taking the antibiotic even if you start to feel better. If You Have a Splint:  Wear it as told by your health care provider. Remove it only as told by your health care provider.  Loosen the splint if your fingers become numb and tingle, or if they turn cold and blue.  If your health care provider approves bathing and showering, cover the splint with a watertight plastic bag to protect it from water. Do not let the splint get wet.  Keep the splint clean and dry.  Do not put pressure on any part of the splint until it is fully hardened. This may take several hours. General Instructions  Raise (elevate) the injured area above the level of your heart while you are sitting or lying down.  Perform range-of-motion exercises only as  told by your health care provider.  Keep all follow-up visits as told by your health care provider. This is important. SEEK IMMEDIATE MEDICAL CARE IF:  Your symptoms return.  Your symptoms get worse.   This information is not intended to replace advice given to you by your health care provider. Make sure you discuss any questions you have with your health care provider.   Document Released: 08/27/2008 Document Revised: 02/19/2015 Document Reviewed: 06/19/2014 Elsevier  Interactive Patient Education [employer].

## 2015-05-20 ENCOUNTER — Encounter: Payer: Self-pay | Admitting: Family Medicine

## 2015-12-01 ENCOUNTER — Telehealth: Payer: Self-pay | Admitting: Family Medicine

## 2015-12-01 MED ORDER — SCOPOLAMINE 1 MG/3DAYS TD PT72: 1.0000 | MEDICATED_PATCH | TRANSDERMAL | Status: DC

## 2015-12-01 NOTE — Telephone Encounter (Signed)
Patient requesting patches for nausea for a 7 day cruise she is going on.  She leaves Friday.  Please advise.

## 2015-12-01 NOTE — Telephone Encounter (Signed)
OK. Scopolamine patch eRx'd to her pharmacy.

## 2015-12-02 NOTE — Telephone Encounter (Signed)
Patient aware of Rx at pharmacy.

## 2016-04-19 ENCOUNTER — Encounter: Payer: Self-pay | Admitting: Family Medicine

## 2016-06-15 ENCOUNTER — Encounter: Payer: Self-pay | Admitting: Family Medicine

## 2016-06-15 ENCOUNTER — Ambulatory Visit (INDEPENDENT_AMBULATORY_CARE_PROVIDER_SITE_OTHER): Payer: BC Managed Care – PPO | Admitting: Family Medicine

## 2016-06-15 VITALS — BP 136/84 | HR 88 | Temp 98.0°F | Resp 20 | Wt 191.5 lb

## 2016-06-15 DIAGNOSIS — J0100 Acute maxillary sinusitis, unspecified: Secondary | ICD-10-CM

## 2016-06-15 DIAGNOSIS — J45990 Exercise induced bronchospasm: Secondary | ICD-10-CM | POA: Diagnosis not present

## 2016-06-15 DIAGNOSIS — R062 Wheezing: Secondary | ICD-10-CM | POA: Diagnosis not present

## 2016-06-15 MED ORDER — IPRATROPIUM-ALBUTEROL 0.5-2.5 (3) MG/3ML IN SOLN
3.0000 mL | Freq: Once | RESPIRATORY_TRACT | Status: AC
  Administered 2016-06-15: 3 mL via RESPIRATORY_TRACT

## 2016-06-15 MED ORDER — PREDNISONE 50 MG PO TABS: 50.0000 mg | ORAL_TABLET | Freq: Every day | ORAL | 0 refills | Status: DC

## 2016-06-15 MED ORDER — DOXYCYCLINE HYCLATE 100 MG PO TABS
100.0000 mg | ORAL_TABLET | Freq: Two times a day (BID) | ORAL | 0 refills | Status: DC
Start: 2016-06-15 — End: 2016-07-09

## 2016-06-15 MED ORDER — ALBUTEROL SULFATE HFA 108 (90 BASE) MCG/ACT IN AERS: 2.0000 | INHALATION_SPRAY | Freq: Four times a day (QID) | RESPIRATORY_TRACT | 1 refills | Status: DC | PRN

## 2016-06-15 NOTE — Patient Instructions (Addendum)
Start doxycyline every 12 hours for 10 days.  Prednisone burst for 5 days, with meal.   After 2-3 weeks, if still feeling fatigued please see your PCP maybe related to fibromyalgia, asthma or other cause.    Sinusitis, Adult Sinusitis is soreness and inflammation of your sinuses. Sinuses are hollow spaces in the bones around your face. They are located:  Around your eyes.  In the middle of your forehead.  Behind your nose.  In your cheekbones. Your sinuses and nasal passages are lined with a stringy fluid (mucus). Mucus normally drains out of your sinuses. When your nasal tissues get inflamed or swollen, the mucus can get trapped or blocked so air cannot flow through your sinuses. This lets bacteria, viruses, and funguses grow, and that leads to infection. Follow these instructions at home: Medicines  Take, use, or apply over-the-counter and prescription medicines only as told by your doctor. These may include nasal sprays.  If you were prescribed an antibiotic medicine, take it as told by your doctor. Do not stop taking the antibiotic even if you start to feel better. Hydrate and Humidify  Drink enough water to keep your pee (urine) clear or pale yellow.  Use a cool mist humidifier to keep the humidity level in your home above 50%.  Breathe in steam for 10-15 minutes, 3-4 times a day or as told by your doctor. You can do this in the bathroom while a hot shower is running.  Try not to spend time in cool or dry air. Rest  Rest as much as possible.  Sleep with your head raised (elevated).  Make sure to get enough sleep each night. General instructions  Put a warm, moist washcloth on your face 3-4 times a day or as told by your doctor. This will help with discomfort.  Wash your hands often with soap and water. If there is no soap and water, use hand sanitizer.  Do not smoke. Avoid being around people who are smoking (secondhand smoke).  Keep all follow-up visits as told by  your doctor. This is important. Contact a doctor if:  You have a fever.  Your symptoms get worse.  Your symptoms do not get better within 10 days. Get help right away if:  You have a very bad headache.  You cannot stop throwing up (vomiting).  You have pain or swelling around your face or eyes.  You have trouble seeing.  You feel confused.  Your neck is stiff.  You have trouble breathing. This information is not intended to replace advice given to you by your health care provider. Make sure you discuss any questions you have with your health care provider. Document Released: 11/17/2007 Document Revised: 01/25/2016 Document Reviewed: 03/26/2015 Elsevier Interactive Patient Education  2017 [place].    Asthma, Adult Asthma is a condition of the lungs in which the airways tighten and narrow. Asthma can make it hard to breathe. Asthma cannot be cured, but medicine and lifestyle changes can help control it. Asthma may be started (triggered) by:  Animal skin flakes (dander).  Dust.  Cockroaches.  Pollen.  Mold.  Smoke.  Cleaning products.  Hair sprays or aerosol sprays.  Paint fumes or strong smells.  Cold air, weather changes, and winds.  Crying or laughing hard.  Stress.  Certain medicines or drugs.  Foods, such as dried fruit, potato chips, and sparkling grape juice.  Infections or conditions (colds, flu).  Exercise.  Certain medical conditions or diseases.  Exercise or tiring  activities. Follow these instructions at home:  Take medicine as told by your doctor.  Use a peak flow meter as told by your doctor. A peak flow meter is a tool that measures how well the lungs are working.  Record and keep track of the peak flow meter's readings.  Understand and use the asthma action plan. An asthma action plan is a written plan for taking care of your asthma and treating your attacks.  To help prevent asthma attacks:  Do not smoke. Stay away  from secondhand smoke.  Change your heating and air conditioning filter often.  Limit your use of fireplaces and wood stoves.  Get rid of pests (such as roaches and mice) and their droppings.  Throw away plants if you see mold on them.  Clean your floors. Dust regularly. Use cleaning products that do not smell.  Have someone vacuum when you are not home. Use a vacuum cleaner with a HEPA filter if possible.  Replace carpet with wood, tile, or vinyl flooring. Carpet can trap animal skin flakes and dust.  Use allergy-proof pillows, mattress covers, and box spring covers.  Wash bed sheets and blankets every week in hot water and dry them in a dryer.  Use blankets that are made of polyester or cotton.  Clean bathrooms and kitchens with bleach. If possible, have someone repaint the walls in these rooms with mold-resistant paint. Keep out of the rooms that are being cleaned and painted.  Wash hands often. Contact a doctor if:  You have make a whistling sound when breaking (wheeze), have shortness of breath, or have a cough even if taking medicine to prevent attacks.  The colored mucus you cough up (sputum) is thicker than usual.  The colored mucus you cough up changes from clear or white to yellow, green, gray, or bloody.  You have problems from the medicine you are taking such as:  A rash.  Itching.  Swelling.  Trouble breathing.  You need reliever medicines more than 2-3 times a week.  Your peak flow measurement is still at 50-79% of your personal best after following the action plan for 1 hour.  You have a fever. Get help right away if:  You seem to be worse and are not responding to medicine during an asthma attack.  You are short of breath even at rest.  You get short of breath when doing very little activity.  You have trouble eating, drinking, or talking.  You have chest pain.  You have a fast heartbeat.  Your lips or fingernails start to turn  blue.  You are light-headed, dizzy, or faint.  Your peak flow is less than 50% of your personal best. This information is not intended to replace advice given to you by your health care provider. Make sure you discuss any questions you have with your health care provider. Document Released: 11/17/2007 Document Revised: 11/06/2015 Document Reviewed: 12/28/2012 Elsevier Interactive Patient Education  2017 [employer].

## 2016-06-15 NOTE — Progress Notes (Signed)
Hannah Garrett , 1965-12-13, 51 y.o., female MRN: [id number] Patient Care Team    Relationship Specialty Notifications Start End  Tammi Sou, MD PCP - General Family Medicine  06/23/12   Jovita Gamma, MD Consulting Physician Neurosurgery  09/22/12   Molli Posey, MD Consulting Physician Obstetrics and Gynecology  04/13/16   Clarene Essex, MD Consulting Physician Gastroenterology  04/19/16     CC: cough  Subjective: Pt presents for an acute OV with complaints of cough  of 2 weeks duration.  Associated symptoms include cough, congestion, ear pressure, fever and fatigue. Initially starting feeling better, Then her energy level drops and she does not feel well again. H/O asthma. Tried albuterol, mucinex, sinus/decongestant.   Allergies  Allergen Reactions  . Codeine Hives  . Darvocet [Propoxyphene N-Acetaminophen] Nausea And Vomiting  . Percocet [Oxycodone-Acetaminophen] Nausea And Vomiting  . Hydrocodone Nausea And Vomiting   Social History  Substance Use Topics  . Smoking status: Current Some Day Smoker    Packs/day: 0.25    Types: Cigarettes  . Smokeless tobacco: Never Used  . Alcohol use Yes     Comment: occasional   Past Medical History:  Diagnosis Date  . Adult ADHD 02/10/2011  . Allergic rhinitis 10/21/2010  . Arthritis   . Asthma 10/20/2010   Much worse as a child, only prn albut as adult  . Back pain 10/21/2010  . BCC (basal cell carcinoma), face 10/21/2010  . Depression 10/20/2010  . Endometriosis 10/21/2010  . Fatigue 10/21/2010  . Fibromyalgia   . Headache, migraine 10/21/2010  . Headache, tension-type 10/21/2010  . Hyperlipidemia, mixed    Trigs >600  . IBS (irritable bowel syndrome)   . Insomnia 02/10/2011  . Irritable bowel syndrome (IBS) 10/21/2010  . Neck pain, chronic 10/20/2010  . Overweight(278.02) 10/21/2010  . Peripheral edema 10/21/2010  . Plantar fasciitis 10/21/2010  . 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 Recurrent sinusitis 10/21/2010  . Right shoulder pain 10/20/2010  .  Tobacco abuse 10/21/2010  . Urge incontinence 10/21/2010   Past Surgical History:  Procedure Laterality Date  . ABDOMINAL HYSTERECTOMY  2004   for endometriosis  . CESAREAN SECTION  1988  . COLONOSCOPY     screening TCS planned  for 06/04/16 as of pt's 04/09/16 o/v with Dr. Watt Climes.  Marland Kitchen DILATION AND CURETTAGE OF UTERUS  1994 and 1995  . left foot surgery  2010  . left knee  2002  . left shoulder  2004  . NECK SURGERY  2003 & 2005  . NOSE SURGERY  14 and 51 yrs old  . right knee  1987  . shock wave on right foot  2009  . sinus surgeries  2005 &2007  . TONSILLECTOMY AND ADENOIDECTOMY  1974   Family History  Problem Relation Age of Onset  . Hypertension Mother   . Diabetes Mother     borderline  . Asthma Mother   . Arthritis Mother   . Hypertension Father   . Arthritis Father   . Cancer Father     prostate  . Heart disease Father     s/p MI  . Asthma Daughter   . Migraines Daughter   . GER disease Daughter   . Cancer Son     CA- calcification, right lower ribs was causing significant breathing trouble after returning from [place]  . Heart murmur Son   . Heart disease Maternal Grandmother   . Diabetes Maternal Grandfather   . Cancer Paternal Grandmother  stomach  . Asthma Daughter   . COPD Brother     smoker, lung disease   Allergies as of 06/15/2016      Reactions   Codeine Hives   Darvocet [propoxyphene N-acetaminophen] Nausea And Vomiting   Percocet [oxycodone-acetaminophen] Nausea And Vomiting   Hydrocodone Nausea And Vomiting      Medication List       Accurate as of 06/15/16  3:39 PM. Always use your most recent med list.          albuterol 108 (90 Base) MCG/ACT inhaler Commonly known as:  VENTOLIN HFA Inhale 2 puffs into the lungs every 6 (six) hours as needed for wheezing.   budesonide 32 MCG/ACT nasal spray Commonly known as:  RHINOCORT AQUA Place 2 sprays into both nostrils daily.   meperidine 50 MG tablet Commonly known as:  DEMEROL Take 50 mg by  mouth every 4 (four) hours as needed for severe pain.   oxybutynin 10 MG 24 hr tablet Commonly known as:  DITROPAN-XL Take 1 tablet (10 mg total) by mouth at bedtime.   PROBIOTIC PO Take by mouth daily.   promethazine 25 MG tablet Commonly known as:  PHENERGAN Take 25 mg by mouth every 4 (four) hours as needed for nausea or vomiting.   scopolamine 1 MG/3DAYS Commonly known as:  TRANSDERM-SCOP Place 1 patch (1.5 mg total) onto the skin every 3 (three) days.       No results found for this or any previous visit (from the past 24 hour(s)). No results found.   ROS: Negative, with the exception of above mentioned in HPI   Objective:  BP 136/84 (BP Location: Right Arm, Patient Position: Sitting, Cuff Size: Large)   Pulse 88   Temp 98 F (36.7 C)   Resp 20   Wt 191 lb 8 oz (86.9 kg)   SpO2 98%   BMI 33.39 kg/m  Body mass index is 33.39 kg/m. Gen: Afebrile. No acute distress. Nontoxic in appearance, well developed, well nourished.  HENT: AT. [place]. Bilateral TM visualized Bilateral air-fluid levels. MMM, no oral lesions. Bilateral nares erythema, swelling and drainage. Throat without erythema or exudates. Postnasal drip present, cough present, tenderness to palpation bilateral maxillary sinus. Eyes:Pupils Equal Round Reactive to light, Extraocular movements intact,  Conjunctiva without redness, discharge or icterus. Neck/lymp/endocrine: Supple, no lymphadenopathy CV: RRR Chest: Mild wheeze, and diminished air sounds bilaterally. Normal respiratory effort.  Assessment/Plan: [name] is a 51 y.o. female present for acute OV for  Asthma, exercise induced Wheezing Acute maxillary sinusitis, recurrence not specified - doxycycline (VIBRA-TABS) 100 MG tablet; Take 1 tablet (100 mg total) by mouth 2 (two) times daily.  Dispense: 20 tablet; Refill: 0 - albuterol (VENTOLIN HFA) 108 (90 Base) MCG/ACT inhaler; Inhale 2 puffs into the lungs every 6 (six) hours as needed for  wheezing.  Dispense: 1 Inhaler; Refill: 1 - ipratropium-albuterol (DUONEB) 0.5-2.5 (3) MG/3ML nebulizer solution 3 mL; Take 3 mLs by nebulization once. - Air movement improved with DuoNeb treatment in the office. Discussed with patient she appears to have a sinus infection, however she does appear to have mild asthma exacerbation. She had reported being increasingly fatigued, possibly prior to onset illness. Discussed with her to follow-up with her primary care physician after acute illness if fatigue remains after 2-4 weeks. Fatigue could be secondary to uncontrolled asthma, versus her fibromyalgia versus other etiology. Patient is in understanding of plan. - Doxycycline, prednisone burst, rest, hydrate.  - refilled albuterol inhaler can  use every 6 hours when necessary during acute illness only. If needing more than 2 times a week for chest tightness, wheezing or shortness of breath she should be seen immediately. - Follow-up in 2-4 weeks as needed    electronically signed by:  Howard Pouch, DO  [place]

## 2016-07-09 ENCOUNTER — Ambulatory Visit (INDEPENDENT_AMBULATORY_CARE_PROVIDER_SITE_OTHER): Payer: BC Managed Care – PPO | Admitting: Family Medicine

## 2016-07-09 ENCOUNTER — Encounter: Payer: Self-pay | Admitting: Family Medicine

## 2016-07-09 VITALS — BP 125/86 | HR 111 | Temp 98.9°F | Resp 20 | Wt 187.8 lb

## 2016-07-09 DIAGNOSIS — R6889 Other general symptoms and signs: Secondary | ICD-10-CM

## 2016-07-09 DIAGNOSIS — H6501 Acute serous otitis media, right ear: Secondary | ICD-10-CM | POA: Diagnosis not present

## 2016-07-09 LAB — POC INFLUENZA A&B (BINAX/QUICKVUE)
Influenza A, POC: NEGATIVE
Influenza B, POC: NEGATIVE

## 2016-07-09 MED ORDER — AMOXICILLIN-POT CLAVULANATE 875-125 MG PO TABS: 1.0000 | ORAL_TABLET | Freq: Two times a day (BID) | ORAL | 0 refills | Status: DC

## 2016-07-09 NOTE — Patient Instructions (Signed)
You do not have the flu. You right ear does appear infected today. I have called in an antibiotic that treats ear infections better.  Continue advil/tylenol for fever, chills or myalgia.     Otitis Media, Adult Otitis media is redness, soreness, and puffiness (swelling) in the space just behind your eardrum (middle ear). It may be caused by allergies or infection. It often happens along with a cold. Follow these instructions at home:  Take your medicine as told. Finish it even if you start to feel better.  Only take over-the-counter or prescription medicines for pain, discomfort, or fever as told by your doctor.  Follow up with your doctor as told. Contact a doctor if:  You have otitis media only in one ear, or bleeding from your nose, or both.  You notice a lump on your neck.  You are not getting better in 3-5 days.  You feel worse instead of better. Get help right away if:  You have pain that is not helped with medicine.  You have puffiness, redness, or pain around your ear.  You get a stiff neck.  You cannot move part of your face (paralysis).  You notice that the bone behind your ear hurts when you touch it. This information is not intended to replace advice given to you by your health care provider. Make sure you discuss any questions you have with your health care provider. Document Released: 11/17/2007 Document Revised: 11/06/2015 Document Reviewed: 12/26/2012 Elsevier Interactive Patient Education  2017 [employer].

## 2016-07-09 NOTE — Progress Notes (Signed)
Hannah Garrett , 1966/03/21, 51 y.o., female MRN: [id number] Patient Care Team    Relationship Specialty Notifications Start End  Tammi Sou, MD PCP - General Family Medicine  06/23/12   Jovita Gamma, MD Consulting Physician Neurosurgery  09/22/12   Molli Posey, MD Consulting Physician Obstetrics and Gynecology  04/13/16   Clarene Essex, MD Consulting Physician Gastroenterology  04/19/16     CC: flu-like symptoms.  Subjective: Pt presents for an acute OV with complaints of cough of 1 days duration.  Associated symptoms include cough, chills, right ear pain and myalgias. She was seen at the beginning of the month for a sinus infection and had improvement. Her right ear has been quite bothersome. She has not had her flu shot   Allergies  Allergen Reactions  . Codeine Hives  . Darvocet [Propoxyphene N-Acetaminophen] Nausea And Vomiting  . Percocet [Oxycodone-Acetaminophen] Nausea And Vomiting  . Hydrocodone Nausea And Vomiting   Social History  Substance Use Topics  . Smoking status: Current Some Day Smoker    Packs/day: 0.25    Types: Cigarettes  . Smokeless tobacco: Never Used  . Alcohol use Yes     Comment: occasional   Past Medical History:  Diagnosis Date  . Adult ADHD 02/10/2011  . Allergic rhinitis 10/21/2010  . Arthritis   . Asthma 10/20/2010   Much worse as a child, only prn albut as adult  . Back pain 10/21/2010  . BCC (basal cell carcinoma), face 10/21/2010  . Depression 10/20/2010  . Endometriosis 10/21/2010  . Fatigue 10/21/2010  . Fibromyalgia   . Headache, migraine 10/21/2010  . Headache, tension-type 10/21/2010  . Hyperlipidemia, mixed    Trigs >600  . IBS (irritable bowel syndrome)   . Insomnia 02/10/2011  . Irritable bowel syndrome (IBS) 10/21/2010  . Neck pain, chronic 10/20/2010  . Overweight(278.02) 10/21/2010  . Peripheral edema 10/21/2010  . Plantar fasciitis 10/21/2010  . 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 Recurrent sinusitis 10/21/2010  . Right shoulder pain 10/20/2010  .  Tobacco abuse 10/21/2010  . Urge incontinence 10/21/2010   Past Surgical History:  Procedure Laterality Date  . ABDOMINAL HYSTERECTOMY  2004   for endometriosis  . CESAREAN SECTION  1988  . COLONOSCOPY     screening TCS planned  for 06/04/16 as of pt's 04/09/16 o/v with Dr. Watt Climes.  Marland Kitchen DILATION AND CURETTAGE OF UTERUS  1994 and 1995  . left foot surgery  2010  . left knee  2002  . left shoulder  2004  . NECK SURGERY  2003 & 2005  . NOSE SURGERY  14 and 51 yrs old  . right knee  1987  . shock wave on right foot  2009  . sinus surgeries  2005 &2007  . TONSILLECTOMY AND ADENOIDECTOMY  1974   Family History  Problem Relation Age of Onset  . Hypertension Mother   . Diabetes Mother     borderline  . Asthma Mother   . Arthritis Mother   . Hypertension Father   . Arthritis Father   . Cancer Father     prostate  . Heart disease Father     s/p MI  . Asthma Daughter   . Migraines Daughter   . GER disease Daughter   . Cancer Son     CA- calcification, right lower ribs was causing significant breathing trouble after returning from [place]  . Heart murmur Son   . Heart disease Maternal Grandmother   . Diabetes Maternal Grandfather   .  Cancer Paternal Grandmother     stomach  . Asthma Daughter   . COPD Brother     smoker, lung disease   Allergies as of 07/09/2016      Reactions   Codeine Hives   Darvocet [propoxyphene N-acetaminophen] Nausea And Vomiting   Percocet [oxycodone-acetaminophen] Nausea And Vomiting   Hydrocodone Nausea And Vomiting      Medication List       Accurate as of 07/09/16  1:44 PM. Always use your most recent med list.          albuterol 108 (90 Base) MCG/ACT inhaler Commonly known as:  VENTOLIN HFA Inhale 2 puffs into the lungs every 6 (six) hours as needed for wheezing.   budesonide 32 MCG/ACT nasal spray Commonly known as:  RHINOCORT AQUA Place 2 sprays into both nostrils daily.   meperidine 50 MG tablet Commonly known as:  DEMEROL Take 50 mg  by mouth every 4 (four) hours as needed for severe pain.   oxybutynin 10 MG 24 hr tablet Commonly known as:  DITROPAN-XL Take 1 tablet (10 mg total) by mouth at bedtime.   PROBIOTIC PO Take by mouth daily.   promethazine 25 MG tablet Commonly known as:  PHENERGAN Take 25 mg by mouth every 4 (four) hours as needed for nausea or vomiting.   scopolamine 1 MG/3DAYS Commonly known as:  TRANSDERM-SCOP Place 1 patch (1.5 mg total) onto the skin every 3 (three) days.       No results found for this or any previous visit (from the past 24 hour(s)). No results found.   ROS: Negative, with the exception of above mentioned in HPI   Objective:  BP 125/86 (BP Location: Left Arm, Patient Position: Sitting, Cuff Size: Large)   Pulse (!) 111   Temp 98.9 F (37.2 C)   Resp 20   Wt 187 lb 12 oz (85.2 kg)   SpO2 98%   BMI 32.74 kg/m  Body mass index is 32.74 kg/m. Gen: Afebrile. No acute distress. Nontoxic in appearance, well developed, well nourished.  HENT: AT. [place]. Bilateral TM visualized, rt TM with erythema and bulging.  MMM, no oral lesions. Bilateral nares WNL. Throat without erythema or exudates. Mild cough.  Eyes:Pupils Equal Round Reactive to light, Extraocular movements intact,  Conjunctiva without redness, discharge or icterus. Neck/lymp/endocrine: Supple,no lymphadenopathy CV: RRR Chest: CTAB, no wheeze or crackles. Good air movement, normal resp effort.  Abd: Soft. NTND. BS present.  Skin: no rashes, purpura or petechiae.   Assessment/Plan: Hannah Garrett is a 51 y.o. female present for acute OV for  Flu-like symptoms - POC Influenza A&B (Binax test): negative Right acute  otitis media, recurrence not specified - rest, hydrate, nsaids.  - amoxicillin-clavulanate (AUGMENTIN) 875-125 MG tablet; Take 1 tablet by mouth 2 (two) times daily.  Dispense: 20 tablet; Refill: 0 - F/U PRN  electronically signed by:  Howard Pouch, DO  [place]

## 2016-08-19 ENCOUNTER — Ambulatory Visit (INDEPENDENT_AMBULATORY_CARE_PROVIDER_SITE_OTHER): Payer: BC Managed Care – PPO | Admitting: Family Medicine

## 2016-08-19 ENCOUNTER — Encounter: Payer: Self-pay | Admitting: Family Medicine

## 2016-08-19 VITALS — BP 152/88 | HR 94 | Temp 98.4°F | Wt 188.8 lb

## 2016-08-19 DIAGNOSIS — B9689 Other specified bacterial agents as the cause of diseases classified elsewhere: Secondary | ICD-10-CM | POA: Diagnosis not present

## 2016-08-19 DIAGNOSIS — J208 Acute bronchitis due to other specified organisms: Secondary | ICD-10-CM | POA: Diagnosis not present

## 2016-08-19 DIAGNOSIS — J019 Acute sinusitis, unspecified: Secondary | ICD-10-CM

## 2016-08-19 DIAGNOSIS — S90851A Superficial foreign body, right foot, initial encounter: Secondary | ICD-10-CM | POA: Diagnosis not present

## 2016-08-19 MED ORDER — DOXYCYCLINE HYCLATE 100 MG PO CAPS: 100.0000 mg | ORAL_CAPSULE | Freq: Two times a day (BID) | ORAL | 0 refills | Status: DC

## 2016-08-19 MED ORDER — BENZONATATE 100 MG PO CAPS: 100.0000 mg | ORAL_CAPSULE | Freq: Two times a day (BID) | ORAL | 0 refills | Status: DC | PRN

## 2016-08-19 MED ORDER — PREDNISONE 20 MG PO TABS: 40.0000 mg | ORAL_TABLET | Freq: Every day | ORAL | 0 refills | Status: DC

## 2016-08-19 NOTE — Progress Notes (Signed)
History of Present Illness:   Hannah Garrett is a 51 y.o. female who presents for evaluation of possible sinus infection. Symptoms include achiness, cough described as productive of purulent sputum, post nasal drip, purulent nasal discharge, sinus pressure and fatigue with no fever, chills, night sweats or weight loss. Onset of symptoms was 1 week ago, gradually worsening since that time.  She is drinking plenty of fluids. Patient is a non-smoker.  Left foot pain. Patient thinks that she stepped on a piece of glass yesterday. She has ben soaking her foot and using baking soda. Pain as improved.   PMHx, SurgHx, SocialHx, Medications, and Allergies were reviewed in the Visit Navigator and updated as appropriate.   Past Medical History:   Past Medical History:  Diagnosis Date  . Adult ADHD 02/10/2011  . Allergic rhinitis 10/21/2010  . Arthritis   . Asthma 10/20/2010   Much worse as a child, only prn albut as adult  . Back pain 10/21/2010  . BCC (basal cell carcinoma), face 10/21/2010  . Depression 10/20/2010  . Endometriosis 10/21/2010  . Fatigue 10/21/2010  . Fibromyalgia   . Headache, migraine 10/21/2010  . Headache, tension-type 10/21/2010  . Hyperlipidemia, mixed    Trigs >600  . IBS (irritable bowel syndrome)   . Insomnia 02/10/2011  . Irritable bowel syndrome (IBS) 10/21/2010  . Neck pain, chronic 10/20/2010  . Overweight(278.02) 10/21/2010  . Peripheral edema 10/21/2010  . Plantar fasciitis 10/21/2010  . 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 Recurrent sinusitis 10/21/2010  . Right shoulder pain 10/20/2010  . Tobacco abuse 10/21/2010  . Urge incontinence 10/21/2010     Past Surgical History:   Past Surgical History:  Procedure Laterality Date  . ABDOMINAL HYSTERECTOMY  2004   for endometriosis  . CESAREAN SECTION  1988  . COLONOSCOPY     screening TCS planned  for 06/04/16 as of pt's 04/09/16 o/v with Dr. Watt Climes.  Marland Kitchen DILATION AND CURETTAGE OF UTERUS  1994 and 1995  . left foot surgery  2010  . left knee   2002  . left shoulder  2004  . NECK SURGERY  2003 & 2005  . NOSE SURGERY  14 and 51 yrs old  . right knee  1987  . shock wave on right foot  2009  . sinus surgeries  2005 &2007  . TONSILLECTOMY AND ADENOIDECTOMY  1974     Allergies:   Allergies  Allergen Reactions  . Codeine Hives  . Darvocet [Propoxyphene N-Acetaminophen] Nausea And Vomiting  . Percocet [Oxycodone-Acetaminophen] Nausea And Vomiting  . Hydrocodone Nausea And Vomiting     Current Medications:   Prior to Admission medications   Medication Sig Start Date End Date Taking? Authorizing Provider  albuterol (VENTOLIN HFA) 108 (90 Base) MCG/ACT inhaler Inhale 2 puffs into the lungs every 6 (six) hours as needed for wheezing. 06/15/16   Renee A Kuneff, DO  meperidine (DEMEROL) 50 MG tablet Take 50 mg by mouth every 4 (four) hours as needed for severe pain.    Historical Provider, MD  oxybutynin (DITROPAN-XL) 10 MG 24 hr tablet Take 1 tablet (10 mg total) by mouth at bedtime. 05/01/14   Tammi Sou, MD  Probiotic Product (PROBIOTIC PO) Take by mouth daily.    Historical Provider, MD  promethazine (PHENERGAN) 25 MG tablet Take 25 mg by mouth every 4 (four) hours as needed for nausea or vomiting.    Historical Provider, MD  scopolamine (TRANSDERM-SCOP) 1 MG/3DAYS Place 1 patch (1.5 mg total)  onto the skin every 3 (three) days. 12/01/15   Tammi Sou, MD     Social History:   Social History  Substance Use Topics  . Smoking status: Current Some Day Smoker    Packs/day: 0.25    Types: Cigarettes  . Smokeless tobacco: Never Used  . Alcohol use Yes     Comment: occasional    Review of Systems:   No unusual headaches, no dizziness. No dyspnea or chest pain on exertion. No abdominal pain, change in bowel habits, black or bloody stools.  No urinary tract symptoms. No new or unusual musculoskeletal symptoms. No edema.  Vitals:   Vitals:   08/19/16 0748  BP: (!) 152/88  Pulse: 94  Temp: 98.4 F (36.9 C)    TempSrc: Oral  SpO2: 98%  Weight: 188 lb 12.8 oz (85.6 kg)     Body mass index is 32.92 kg/m.  Physical Exam:   General: alert, cooperative, in NAD Eyes: PERRL, EOMI, conjunctiva clear HENT: oropharynx clear, moist mucous membranes, posterior pharyngeal erythema, PND, bilateral maxillary and frontal sinus ttp Cardiovascular: regular rate and rhythm, S1, S2 normal, no murmur, click, rub or gallop Respiratory: central lung congestion, cough Abdomen: soft, non-tender; bowel sounds normal; no masses,  no organomegaly Extremities: extremities normal, atraumatic, no cyanosis or edema Pulses: 2+ and symmetric Skin: Skin color, texture, turgor normal. No rashes or lesions  Assessment and Plan:    Nelli was seen today for sinus problem.  Diagnoses and all orders for this visit:  Acute bacterial sinusitis -     doxycycline (VIBRAMYCIN) 100 MG capsule; Take 1 capsule (100 mg total) by mouth 2 (two) times daily. -     predniSONE (DELTASONE) 20 MG tablet; Take 2 tablets (40 mg total) by mouth daily.  Acute bacterial bronchitis -     benzonatate (TESSALON) 100 MG capsule; Take 1 capsule (100 mg total) by mouth 2 (two) times daily as needed for cough.  Foreign body in right foot, initial encounter      - I was unable to identify any FB today, though the patient did have some discomfort when I palpated her first metatarsal. I recommended an aperture pap to offload pressure for the next several days, along with continued foot soaks. The patient will call if not improving.    . Reviewed expectations re: course of current medical issues. . Discussed self-management of symptoms. . Outlined signs and symptoms indicating need for more acute intervention. . Patient verbalized understanding and all questions were answered. . See orders for this visit as documented in the electronic medical record. . Patient received an After Visit Summary.  Briscoe Deutscher, D.O.

## 2016-08-19 NOTE — Progress Notes (Signed)
Pre visit review using our clinic review tool, if applicable. No additional management support is needed unless otherwise documented below in the visit note. 

## 2016-11-23 ENCOUNTER — Emergency Department (HOSPITAL_BASED_OUTPATIENT_CLINIC_OR_DEPARTMENT_OTHER): Payer: BC Managed Care – PPO

## 2016-11-23 ENCOUNTER — Encounter: Payer: Self-pay | Admitting: Family Medicine

## 2016-11-23 ENCOUNTER — Emergency Department (HOSPITAL_BASED_OUTPATIENT_CLINIC_OR_DEPARTMENT_OTHER)
Admission: EM | Admit: 2016-11-23 | Discharge: 2016-11-24 | Disposition: A | Discharge status: Home / Self Care | Payer: BC Managed Care – PPO | Attending: Emergency Medicine | Admitting: Emergency Medicine

## 2016-11-23 ENCOUNTER — Emergency Department (HOSPITAL_COMMUNITY): Payer: BC Managed Care – PPO

## 2016-11-23 ENCOUNTER — Ambulatory Visit (INDEPENDENT_AMBULATORY_CARE_PROVIDER_SITE_OTHER): Payer: BC Managed Care – PPO | Admitting: Family Medicine

## 2016-11-23 ENCOUNTER — Encounter (HOSPITAL_BASED_OUTPATIENT_CLINIC_OR_DEPARTMENT_OTHER): Payer: Self-pay | Admitting: Emergency Medicine

## 2016-11-23 VITALS — BP 116/80 | HR 90 | Resp 20 | Wt 191.2 lb

## 2016-11-23 DIAGNOSIS — R42 Dizziness and giddiness: Secondary | ICD-10-CM

## 2016-11-23 DIAGNOSIS — R270 Ataxia, unspecified: Secondary | ICD-10-CM | POA: Diagnosis not present

## 2016-11-23 DIAGNOSIS — F17210 Nicotine dependence, cigarettes, uncomplicated: Secondary | ICD-10-CM | POA: Diagnosis not present

## 2016-11-23 DIAGNOSIS — R2990 Unspecified symptoms and signs involving the nervous system: Secondary | ICD-10-CM | POA: Diagnosis not present

## 2016-11-23 DIAGNOSIS — R110 Nausea: Secondary | ICD-10-CM | POA: Diagnosis not present

## 2016-11-23 LAB — CBG MONITORING, ED: GLUCOSE-CAPILLARY: 86 mg/dL (ref 65–99)

## 2016-11-23 LAB — CBC
HCT: 39.5 % (ref 36.0–46.0)
HEMOGLOBIN: 13.3 g/dL (ref 12.0–15.0)
MCH: 28.7 pg (ref 26.0–34.0)
MCHC: 33.7 g/dL (ref 30.0–36.0)
MCV: 85.3 fL (ref 78.0–100.0)
PLATELETS: 267 10*3/uL (ref 150–400)
RBC: 4.63 MIL/uL (ref 3.87–5.11)
RDW: 14 % (ref 11.5–15.5)
WBC: 10.5 10*3/uL (ref 4.0–10.5)

## 2016-11-23 LAB — CBC WITH DIFFERENTIAL/PLATELET
BASOS ABS: 0 {cells}/uL (ref 0–200)
BASOS PCT: 0 %
EOS ABS: 222 {cells}/uL (ref 15–500)
Eosinophils Relative: 2 %
HEMATOCRIT: 39.2 % (ref 35.0–45.0)
Hemoglobin: 13.3 g/dL (ref 11.7–15.5)
Lymphocytes Relative: 47 %
Lymphs Abs: 5217 cells/uL — ABNORMAL HIGH (ref 850–3900)
MCH: 28.3 pg (ref 27.0–33.0)
MCHC: 33.9 g/dL (ref 32.0–36.0)
MCV: 83.4 fL (ref 80.0–100.0)
MONO ABS: 666 {cells}/uL (ref 200–950)
MPV: 9 fL (ref 7.5–12.5)
Monocytes Relative: 6 %
NEUTROS ABS: 4995 {cells}/uL (ref 1500–7800)
Neutrophils Relative %: 45 %
Platelets: 275 10*3/uL (ref 140–400)
RBC: 4.7 MIL/uL (ref 3.80–5.10)
RDW: 14.5 % (ref 11.0–15.0)
WBC: 11.1 10*3/uL — ABNORMAL HIGH (ref 3.8–10.8)

## 2016-11-23 LAB — BASIC METABOLIC PANEL
ANION GAP: 8 (ref 5–15)
BUN: 14 mg/dL (ref 6–20)
CALCIUM: 9 mg/dL (ref 8.9–10.3)
CO2: 24 mmol/L (ref 22–32)
CREATININE: 0.85 mg/dL (ref 0.44–1.00)
Chloride: 104 mmol/L (ref 101–111)
Glucose, Bld: 91 mg/dL (ref 65–99)
Potassium: 3.5 mmol/L (ref 3.5–5.1)
SODIUM: 136 mmol/L (ref 135–145)

## 2016-11-23 LAB — COMPREHENSIVE METABOLIC PANEL
ALBUMIN: 4.1 g/dL (ref 3.6–5.1)
ALK PHOS: 82 U/L (ref 33–130)
ALT: 12 U/L (ref 6–29)
AST: 14 U/L (ref 10–35)
BUN: 14 mg/dL (ref 7–25)
CALCIUM: 9.2 mg/dL (ref 8.6–10.4)
CO2: 24 mmol/L (ref 20–31)
Chloride: 103 mmol/L (ref 98–110)
Creat: 0.91 mg/dL (ref 0.50–1.05)
Glucose, Bld: 83 mg/dL (ref 65–99)
POTASSIUM: 4.1 mmol/L (ref 3.5–5.3)
Sodium: 136 mmol/L (ref 135–146)
Total Bilirubin: 0.2 mg/dL (ref 0.2–1.2)
Total Protein: 7 g/dL (ref 6.1–8.1)

## 2016-11-23 LAB — POC URINALSYSI DIPSTICK (AUTOMATED)
BILIRUBIN UA: NEGATIVE
GLUCOSE UA: NEGATIVE
Ketones, UA: NEGATIVE
LEUKOCYTES UA: NEGATIVE
NITRITE UA: NEGATIVE
Protein, UA: NEGATIVE
RBC UA: NEGATIVE
Spec Grav, UA: >=1.03 — AB (ref 1.010–1.025)
Urobilinogen, UA: 0.2 E.U./dL
pH, UA: 5.5 (ref 5.0–8.0)

## 2016-11-23 MED ORDER — LORAZEPAM 2 MG/ML IJ SOLN
1.0000 mg | Freq: Once | INTRAMUSCULAR | Status: AC
  Administered 2016-11-23: 1 mg via INTRAVENOUS
  Filled 2016-11-23: qty 1

## 2016-11-23 MED ORDER — MECLIZINE HCL 25 MG PO TABS
25.0000 mg | ORAL_TABLET | Freq: Once | ORAL | Status: AC
  Administered 2016-11-23: 25 mg via ORAL
  Filled 2016-11-23: qty 1

## 2016-11-23 MED ORDER — MECLIZINE HCL 25 MG PO TABS: 25.0000 mg | ORAL_TABLET | Freq: Three times a day (TID) | ORAL | 0 refills | Status: DC | PRN

## 2016-11-23 MED ORDER — ONDANSETRON HCL 4 MG PO TABS: 4.0000 mg | ORAL_TABLET | Freq: Three times a day (TID) | ORAL | 0 refills | Status: DC | PRN

## 2016-11-23 NOTE — Patient Instructions (Addendum)
If any worsening symptoms you are to go straight to ED and not wait on scheduling of the image study.  If desired would be appropriate to go to ED now.   I am concerned with your exam today.     Ischemic Stroke An ischemic stroke is the sudden death of brain tissue. Blood carries oxygen to all areas of the body. This type of stroke happens when your blood does not flow to your brain like normal. Your brain cannot get the oxygen it needs. This is an emergency. It must be treated right away. Symptoms of a stroke usually happen all of a sudden. You may notice them when you wake up. They can include:  Weakness or loss of feeling in your face, arm, or leg. This often happens on one side of the body.  Trouble walking.  Trouble moving your arms or legs.  Loss of balance or coordination.  Feeling confused.  Trouble talking or understanding what people are saying.  Slurred speech.  Trouble seeing.  Seeing two of one object (double vision).  Feeling dizzy.  Feeling sick to your stomach (nauseous) and throwing up (vomiting).  A very bad headache for no reason.  Get help as soon as any of these problems start. This is important. Some treatments work better if they are given right away. These include:  Aspirin.  Medicines to control blood pressure.  A shot (injection) of medicine to break up the blood clot.  Treatments given in the blood vessel (artery) to take out the clot or break it up.  Other treatments may include:  Oxygen.  Fluids given through an IV tube.  Medicines to thin out your blood.  Procedures to help your blood flow better.  What increases the risk? Certain things may make you more likely to have a stroke. Some of these are things that you can change, such as:  Being very overweight (obesity).  Smoking.  Taking birth control pills.  Not being active.  Drinking too much alcohol.  Using drugs.  Other risk factors include:  High blood  pressure.  High cholesterol.  Diabetes.  Heart disease.  Being [place] American, Native American, Hispanic, or [place] Native.  Being over age 78.  Family history of stroke.  Having had blood clots, stroke, or warning stroke (transient ischemic attack, TIA) in the past.  Sickle cell disease.  Being a woman with a history of high blood pressure in pregnancy (preeclampsia).  Migraine headache.  Sleep apnea.  Having an irregular heartbeat (atrial fibrillation).  Long-term (chronic) diseases that cause soreness and swelling (inflammation).  Disorders that affect how your blood clots.  Follow these instructions at home: Medicines  Take over-the-counter and prescription medicines only as told by your doctor.  If you were told to take aspirin or another medicine to thin your blood, take it exactly as told by your doctor. ? Taking too much of the medicine can cause bleeding. ? If you do not take enough, it may not work as well.  Know the side effects of your medicines. If you are taking a blood thinner, make sure you: ? Hold pressure over any cuts for longer than usual. ? Tell your dentist and other doctors that you take this medicine. ? Avoid activities that may cause damage or injury to your body. Eating and drinking  Follow instructions from your doctor about what you cannot eat or drink.  Eat healthy foods.  If you have trouble with swallowing, do these things to avoid  choking: ? Take small bites when eating. ? Eat foods that are soft or pureed. Safety  Follow instructions from your health care team about physical activity.  Use a walker or cane as told by your doctor.  Keep your home safe so you do not fall. This may include: ? Having experts look at your home to make sure it is safe. ? Putting grab bars in the bedroom and bathroom. ? Using raised toilets. ? Putting a seat in the shower. General instructions  Do not use any tobacco products. ? Examples of  these are cigarettes, chewing tobacco, and e-cigarettes. ? If you need help quitting, ask your doctor.  Limit how much alcohol you drink. This means no more than 1 drink a day for nonpregnant women and 2 drinks a day for men. One drink equals 12 oz of beer, 5 oz of wine, or 1 oz of hard liquor.  If you need help to stop using drugs or alcohol, ask your doctor to refer you to a program or specialist.  Stay active. Exercise as told by your doctor.  Keep all follow-up visits as told by your doctor. This is important. Get help right away if:  You suddenly: ? Have weakness or loss of feeling in your face, arm, or leg. ? Feel confused. ? Have trouble talking or understanding what people are saying. ? Have trouble seeing. ? Have trouble walking. ? Have trouble moving your arms or legs. ? Feel dizzy. ? Lose your balance or coordination. ? Have a very bad headache and you do not know why.  You pass out (lose consciousness) or almost pass out.  You have jerky movements that you cannot control (seizure). These symptoms may be an emergency. Do not wait to see if the symptoms will go away. Get medical help right away. Call your local emergency services (911 in the [place] Do not drive yourself to the hospital. This information is not intended to replace advice given to you by your health care provider. Make sure you discuss any questions you have with your health care provider. Document Released: 05/20/2011 Document Revised: 11/11/2015 Document Reviewed: 08/27/2015 Elsevier Interactive Patient Education  [employer].

## 2016-11-23 NOTE — ED Triage Notes (Addendum)
Patient reports dizziness, headache, blurred vision, difficulty walking since last Monday.  States that PCP saw her today and says she was referred to ER for MRI to rule out CVA.

## 2016-11-23 NOTE — ED Notes (Signed)
[facility] called @ 2018.  Spoke with Santiago Glad

## 2016-11-23 NOTE — ED Notes (Signed)
Patient transported to MRI via stretcher.

## 2016-11-23 NOTE — ED Provider Notes (Signed)
[facility]PT [facility] Provider Note   CSN: [id number] Arrival date & time: 11/23/16  1750   By signing my name below, I, Evelene Croon, attest that this documentation has been prepared under the direction and in the presence of Duffy Bruce, MD . Electronically Signed: Evelene Croon, Scribe. 11/23/2016. 6:53 PM.  History   Chief Complaint Chief Complaint  Patient presents with  . Dizziness    The history is provided by the patient. No language interpreter was used.     HPI Comments:  Hannah Garrett is a 51 y.o. female who presents to the Emergency Department complaining of intermittent episodes of dizziness x ~ 1 week. Pt reports associated nausea, and blurred vision. She was advised to come to the ED for an MRI by her PCP. She denies h/o CVA, HTN and DM. No slurred speech, difficulty swallowing,  fullness in her ears or tinnitus. She has been taking a decongestant without relief. Pt is a current smoker.   Past Medical History:  Diagnosis Date  . Adult ADHD 02/10/2011  . Allergic rhinitis 10/21/2010  . Arthritis   . Asthma 10/20/2010   Much worse as a child, only prn albut as adult  . Back pain 10/21/2010  . BCC (basal cell carcinoma), face 10/21/2010  . Depression 10/20/2010  . Endometriosis 10/21/2010  . Fatigue 10/21/2010  . Fibromyalgia   . Headache, migraine 10/21/2010  . Headache, tension-type 10/21/2010  . Hyperlipidemia, mixed    Trigs >600  . IBS (irritable bowel syndrome)   . Insomnia 02/10/2011  . Irritable bowel syndrome (IBS) 10/21/2010  . Neck pain, chronic 10/20/2010  . Overweight(278.02) 10/21/2010  . Peripheral edema 10/21/2010  . Plantar fasciitis 10/21/2010  . 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 Recurrent sinusitis 10/21/2010  . Right shoulder pain 10/20/2010  . Tobacco abuse 10/21/2010  . Urge incontinence 10/21/2010    Patient Active Problem List   Diagnosis Date Noted  . Injury of left hand 05/19/2015  . Tobacco dependence 08/08/2012  . Peripheral neuropathy 08/07/2012  . Wrist  pain, chronic 08/07/2012  . Adult ADHD 08/07/2012  . Health maintenance examination 07/03/2012  . Suprapubic pain, acute 03/09/2011  . Insomnia 02/10/2011  . Fibromyalgia 10/21/2010  . Allergic rhinitis 10/21/2010  . Recurrent sinusitis 10/21/2010  . Irritable bowel syndrome (IBS) 10/21/2010  . Hyperlipidemia 10/21/2010  . Back pain 10/21/2010  . 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 Overweight(278.02) 10/21/2010  . Fatigue 10/21/2010  . Peripheral edema 10/21/2010  . Asthma, exercise induced 10/20/2010  . Depression 10/20/2010  . Right shoulder pain 10/20/2010  . Neck pain, chronic 10/20/2010    Past Surgical History:  Procedure Laterality Date  . ABDOMINAL HYSTERECTOMY  2004   for endometriosis  . CESAREAN SECTION  1988  . COLONOSCOPY     screening TCS planned  for 06/04/16 as of pt's 04/09/16 o/v with Dr. Watt Climes.  Marland Kitchen DILATION AND CURETTAGE OF UTERUS  1994 and 1995  . left foot surgery  2010  . left knee  2002  . left shoulder  2004  . NECK SURGERY  2003 & 2005  . NOSE SURGERY  14 and 51 yrs old  . right knee  1987  . shock wave on right foot  2009  . sinus surgeries  2005 &2007  . TONSILLECTOMY AND ADENOIDECTOMY  1974    OB History    No data available       Home Medications    Prior to Admission medications   Medication Sig Start Date  End Date Taking? Authorizing Provider  albuterol (VENTOLIN HFA) 108 (90 Base) MCG/ACT inhaler Inhale 2 puffs into the lungs every 6 (six) hours as needed for wheezing. 06/15/16   Kuneff, Renee A, DO  meclizine (ANTIVERT) 25 MG tablet Take 1 tablet (25 mg total) by mouth 3 (three) times daily as needed for dizziness. 11/23/16   Kuneff, Renee A, DO  ondansetron (ZOFRAN) 4 MG tablet Take 1 tablet (4 mg total) by mouth every 8 (eight) hours as needed for nausea or vomiting. 11/23/16   Kuneff, Renee A, DO  oxybutynin (DITROPAN-XL) 10 MG 24 hr tablet Take 1 tablet (10 mg total) by mouth at bedtime. Patient not taking: Reported on 11/23/2016 05/01/14    Tammi Sou, MD  Probiotic Product (PROBIOTIC PO) Take by mouth daily.    [provider]    Family History Family History  Problem Relation Age of Onset  . Hypertension Mother   . Diabetes Mother        borderline  . Asthma Mother   . Arthritis Mother   . Hypertension Father   . Arthritis Father   . Cancer Father        prostate  . Heart disease Father        s/p MI  . Asthma Daughter   . Migraines Daughter   . GER disease Daughter   . Cancer Son        CA- calcification, right lower ribs was causing significant breathing trouble after returning from [place]  . Heart murmur Son   . Heart disease Maternal Grandmother   . Diabetes Maternal Grandfather   . Cancer Paternal Grandmother        stomach  . Asthma Daughter   . COPD Brother        smoker, lung disease    Social History Social History  Substance Use Topics  . Smoking status: Current Some Day Smoker    Packs/day: 0.25    Types: Cigarettes  . Smokeless tobacco: Never Used  . Alcohol use Yes     Comment: occasional     Allergies   Codeine; Darvocet [propoxyphene n-acetaminophen]; Percocet [oxycodone-acetaminophen]; and Hydrocodone   Review of Systems Review of Systems  Eyes: Positive for visual disturbance.  Gastrointestinal: Positive for nausea.  Neurological: Positive for dizziness.  All other systems reviewed and are negative.    Physical Exam Updated Vital Signs BP 127/84 (BP Location: Left Arm)   Pulse 89   Temp 98.7 F (37.1 C) (Oral)   Resp 20   Ht 5\' 4"  (1.626 m)   Wt 86.6 kg (191 lb)   SpO2 99%   BMI 32.79 kg/m   Physical Exam  Constitutional: She is oriented to person, place, and time. She appears well-developed and well-nourished. No distress.  HENT:  Head: Normocephalic and atraumatic.  Bilateral serous effusions; no erythema   Eyes: Conjunctivae are normal.  Neck: Neck supple.  Cardiovascular: Normal rate, regular rhythm and normal heart sounds.  Exam reveals  no friction rub.   No murmur heard. Pulmonary/Chest: Effort normal and breath sounds normal. No respiratory distress. She has no wheezes. She has no rales.  Abdominal: She exhibits no distension.  Musculoskeletal: She exhibits no edema.  Neurological: She is alert and oriented to person, place, and time. She exhibits normal muscle tone.  Skin: Skin is warm. Capillary refill takes less than 2 seconds.  Psychiatric: She has a normal mood and affect.  Nursing note and vitals reviewed.   Neurological Exam:  Mental Status: Alert and oriented to person, place, and time. Attention and concentration normal. Speech clear. Recent memory is intact. Cranial Nerves: Visual fields grossly intact. EOMI and PERRLA. Mild left-beating nystagmus with leftward gaze. Facial sensation intact at forehead, maxillary cheek, and chin/mandible bilaterally. No facial asymmetry or weakness. Hearing grossly normal. Uvula is midline, and palate elevates symmetrically. Normal SCM and trapezius strength. Tongue midline without fasciculations. Motor: Muscle strength 5/5 in proximal and distal UE and LE bilaterally. No pronator drift. Muscle tone normal. Reflexes: 2+ and symmetrical in all four extremities.  Sensation: Intact to light touch in upper and lower extremities distally bilaterally.  Gait: Normal without ataxia. Coordination: Mild past-pointing/dysmetria on LUE.    ED Treatments / Results  DIAGNOSTIC STUDIES:  Oxygen Saturation is 100% on RA, normal by my interpretation.    COORDINATION OF CARE:  6:48 PM Discussed treatment plan with pt at bedside and pt agreed to plan.  Labs (all labs ordered are listed, but only abnormal results are displayed) Labs Reviewed  BASIC METABOLIC PANEL  CBC  CBG MONITORING, ED    EKG  EKG Interpretation  Date/Time:  Tuesday November 23 2016 18:02:52 EDT Ventricular Rate:  83 PR Interval:    QRS Duration: 93 QT Interval:  384 QTC Calculation: 452 R Axis:   25 Text  Interpretation:  Sinus rhythm Low voltage, extremity and precordial leads No acute ST elevations Confirmed by Duffy Bruce [phone]) on 11/23/2016 7:04:24 PM       Radiology No results found.  Procedures Procedures (including critical care time)  Medications Ordered in ED Medications  LORazepam (ATIVAN) injection 1 mg (1 mg Intravenous Given 11/23/16 1951)  meclizine (ANTIVERT) tablet 25 mg (25 mg Oral Given 11/23/16 1955)     Initial Impression / Assessment and Plan / ED Course  I have reviewed the triage vital signs and the nursing notes.  Pertinent labs & imaging results that were available during my care of the patient were reviewed by me and considered in my medical decision making (see chart for details).     51 year old female here with persistent vertigo 2 weeks. Symptoms are consistent with possible peripheral vertigo, although central etiology is also on the differential. She has had serous effusions bilaterally, consistent with possible inner ear dysfunction, but given the persistence of her symptoms as well as abnormal findings on exam, will obtain MRI tonight. Patient is 2 weeks after last known normal, and given no signs of blleed, and would like to hold on CT scan in lieu of MRI and I feel this is reasonable. Will transfer [facility] for [facility] and possible neuro evaluation as needed. Ativan given for vertigo.  Final Clinical Impressions(s) / ED Diagnoses   Final diagnoses:  Dizziness  Vertigo    New Prescriptions New Prescriptions   No medications on file   I personally performed the services described in this documentation, which was scribed in my presence. The recorded information has been reviewed and is accurate.     Duffy Bruce, MD 11/23/16 2106

## 2016-11-23 NOTE — Progress Notes (Signed)
Hannah Garrett , 1966-05-16, 51 y.o., female MRN: [id number] Patient Care Team    Relationship Specialty Notifications Start End  McGowen, Adrian Blackwater, MD PCP - General Family Medicine  06/23/12   Jovita Gamma, MD Consulting Physician Neurosurgery  09/22/12   Molli Posey, MD Consulting Physician Obstetrics and Gynecology  04/13/16   Clarene Essex, MD Consulting Physician Gastroenterology  04/19/16     Chief Complaint  Patient presents with  . Dizziness    x 1 week     Subjective: Pt presents for an OV with complaints of "vertigo" of x1 week duration.  Associated symptoms include nausea, headache, difficulty walking, increased sensitivity to bright light and difficulty focusing with her vision. She reports symptoms started last Monday after getting out of the shower she became very lightheaded, everything was going "black" and she had to grab on to wall to not to pass out. She reports that lasted a couple minutes she did not lose consciousness. She denies chest pain, shortness of breath during that time. She does endorse dizziness that followed. She reports feeling like people are talking to her (at work) but she was not able to focus or understand what they were saying to her this week. She has needed to hold onto the wall in order to walk because she would tilt when walking. She denies fever, chills, vomit. She is tolerating PO.  No flowsheet data found.  Allergies  Allergen Reactions  . Codeine Hives  . Darvocet [Propoxyphene N-Acetaminophen] Nausea And Vomiting  . Percocet [Oxycodone-Acetaminophen] Nausea And Vomiting  . Hydrocodone Nausea And Vomiting   Social History  Substance Use Topics  . Smoking status: Current Some Day Smoker    Packs/day: 0.25    Types: Cigarettes  . Smokeless tobacco: Never Used  . Alcohol use Yes     Comment: occasional   Past Medical History:  Diagnosis Date  . Adult ADHD 02/10/2011  . Allergic rhinitis 10/21/2010  . Arthritis   . Asthma  10/20/2010   Much worse as a child, only prn albut as adult  . Back pain 10/21/2010  . BCC (basal cell carcinoma), face 10/21/2010  . Depression 10/20/2010  . Endometriosis 10/21/2010  . Fatigue 10/21/2010  . Fibromyalgia   . Headache, migraine 10/21/2010  . Headache, tension-type 10/21/2010  . Hyperlipidemia, mixed    Trigs >600  . IBS (irritable bowel syndrome)   . Insomnia 02/10/2011  . Irritable bowel syndrome (IBS) 10/21/2010  . Neck pain, chronic 10/20/2010  . Overweight(278.02) 10/21/2010  . Peripheral edema 10/21/2010  . Plantar fasciitis 10/21/2010  . 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 Recurrent sinusitis 10/21/2010  . Right shoulder pain 10/20/2010  . Tobacco abuse 10/21/2010  . Urge incontinence 10/21/2010   Past Surgical History:  Procedure Laterality Date  . ABDOMINAL HYSTERECTOMY  2004   for endometriosis  . CESAREAN SECTION  1988  . COLONOSCOPY     screening TCS planned  for 06/04/16 as of pt's 04/09/16 o/v with Dr. Watt Climes.  Marland Kitchen DILATION AND CURETTAGE OF UTERUS  1994 and 1995  . left foot surgery  2010  . left knee  2002  . left shoulder  2004  . NECK SURGERY  2003 & 2005  . NOSE SURGERY  14 and 51 yrs old  . right knee  1987  . shock wave on right foot  2009  . sinus surgeries  2005 &2007  . TONSILLECTOMY AND ADENOIDECTOMY  1974   Family History  Problem Relation Age of  Onset  . Hypertension Mother   . Diabetes Mother        borderline  . Asthma Mother   . Arthritis Mother   . Hypertension Father   . Arthritis Father   . Cancer Father        prostate  . Heart disease Father        s/p MI  . Asthma Daughter   . Migraines Daughter   . GER disease Daughter   . Cancer Son        CA- calcification, right lower ribs was causing significant breathing trouble after returning from [place]  . Heart murmur Son   . Heart disease Maternal Grandmother   . Diabetes Maternal Grandfather   . Cancer Paternal Grandmother        stomach  . Asthma Daughter   . COPD Brother        smoker, lung disease    Allergies as of 11/23/2016      Reactions   Codeine Hives   Darvocet [propoxyphene N-acetaminophen] Nausea And Vomiting   Percocet [oxycodone-acetaminophen] Nausea And Vomiting   Hydrocodone Nausea And Vomiting      Medication List       Accurate as of 11/23/16  4:21 PM. Always use your most recent med list.          albuterol 108 (90 Base) MCG/ACT inhaler Commonly known as:  VENTOLIN HFA Inhale 2 puffs into the lungs every 6 (six) hours as needed for wheezing.   oxybutynin 10 MG 24 hr tablet Commonly known as:  DITROPAN-XL Take 1 tablet (10 mg total) by mouth at bedtime.   PROBIOTIC PO Take by mouth daily.       All past medical history, surgical history, allergies, family history, immunizations andmedications were updated in the EMR today and reviewed under the history and medication portions of their EMR.     ROS: Negative, with the exception of above mentioned in HPI   Objective:  BP 116/80 (BP Location: Right Arm, Cuff Size: Normal)   Pulse 90   Resp 20   Wt 191 lb 4 oz (86.8 kg)   SpO2 98%   BMI 33.35 kg/m  Body mass index is 33.35 kg/m. Gen: Afebrile.. Nontoxic in appearance, well developed, well nourished. Guarded in walking and turning head.  HENT: AT. [place]. Bilateral TM visualized without fullness or erythema. MMM, no oral lesions. Bilateral nares without erythema or swelling. Throat without erythema or exudates. No cough or hoarseness.  Eyes:Pupils Equal Round Reactive to light, Extraocular movements intact,  Conjunctiva without redness, discharge or icterus. Mild decrease in left visual field. No papilledema identified. Mild discomfort to light bilaterally.   Neck/lymp/endocrine: Supple,no  lymphadenopathy CV: RRR no murmur, no edema. No carotid bruits.  Chest: CTAB, no wheeze or crackles. Good air movement, normal resp effort.  Abd: Soft. NTND. BS present.  Skin: no rashes, purpura or petechiae.  Neuro/msk: guarded gate, unsteady. PERLA. EOMi. Alert.  Oriented x3  Muscle strength 5/5 bilateral Upper and lower extremity, with the exception of weak left shoulder shrug, mild left visual field defect, mild left pronator drift. Psych: Normal affect, dress and demeanor. Normal speech. Normal thought content and judgment.  No exam data present No results found. Results for orders placed or performed in visit on 11/23/16 (from the past 24 hour(s))  POCT Urinalysis Dipstick (Automated)     Status: Abnormal   Collection Time: 11/23/16  4:16 PM  Result Value Ref Range   Color, UA yellow  Clarity, UA clear    Glucose, UA Negative    Bilirubin, UA Negative    Ketones, UA Negative    Spec Grav, UA >=1.030 (A) 1.010 - 1.025   Blood, UA Negative    pH, UA 5.5 5.0 - 8.0   Protein, UA Negative    Urobilinogen, UA 0.2 0.2 or 1.0 E.U./dL   Nitrite, UA Negative    Leukocytes, UA Negative Negative    Assessment/Plan: Hannah Garrett is a 51 y.o. female present for OV for  Nausea Dizziness Vertigo Ataxia Abnormal neurological exam - I have concerns over pts HPI and exam. She does not want to got to ED. She has  had symptoms for 1 week now, not worsening, but not improving. Discussed possible ddx of stroke, ICP, tumor with her exam. Vertigo typically does not cause such neuro symptoms, especially for this length of time. Initial symptoms started after a presyncopal episode, follwed by ataxia and confusion. She has a h/o hyperlipidemia with hypertriglyceridemia > 600 without medication use.   - CBC w/Diff - Comp Met (CMET) - recommendations were stressed to her.  MRI HEAD WO CONTRAST; Future--> attempted to schedule outpatient but was unable to do so quickly, pt advised to go to ED now. encouraged her not to drive. She was agreeable to plan.   Reviewed expectations re: course of current medical issues.  Discussed self-management of symptoms.  Outlined signs and symptoms indicating need for more acute intervention.  Patient verbalized  understanding and all questions were answered.  Patient received an After-Visit Summary.   Note is dictated utilizing voice recognition software. Although note has been proof read prior to signing, occasional typographical errors still can be missed. If any questions arise, please do not hesitate to call for verification.   electronically signed by:  Howard Pouch, DO  [place]

## 2016-11-23 NOTE — ED Notes (Signed)
Patient returned from MRI and placed back on monitor

## 2016-11-23 NOTE — ED Notes (Signed)
Pt on monitor 

## 2016-11-23 NOTE — ED Provider Notes (Signed)
[facility] DEPT Provider Note   CSN: [id number] Arrival date & time: 11/23/16  1750     History   Chief Complaint Chief Complaint  Patient presents with  . Dizziness    HPI Hannah Garrett is a 51 y.o. female.  HPI    Hannah Garrett is a 51 y.o. female, with a history of Asthma, fibromyalgia, recurrent sinusitis, presenting to the ED with dizziness. Room spinning dizziness for the past week, persistent, but worse with sitting upright or standing. Worse with head movement side to side. Has never had this problem before. Does not improve with laying on one side or the other. Endorses occasional nausea. Patient was seen at [facility] this evening and transferred to this facility for continued work up, specifically MRI. Was sent to the ED by her PCP for MRI, but patient went to [facility].  Denies falls/trauma, headache, weakness, numbness, difficulty swallowing, LOC, or any other complaints.     Past Medical History:  Diagnosis Date  . Adult ADHD 02/10/2011  . Allergic rhinitis 10/21/2010  . Arthritis   . Asthma 10/20/2010   Much worse as a child, only prn albut as adult  . Back pain 10/21/2010  . BCC (basal cell carcinoma), face 10/21/2010  . Depression 10/20/2010  . Endometriosis 10/21/2010  . Fatigue 10/21/2010  . Fibromyalgia   . Headache, migraine 10/21/2010  . Headache, tension-type 10/21/2010  . Hyperlipidemia, mixed    Trigs >600  . IBS (irritable bowel syndrome)   . Insomnia 02/10/2011  . Irritable bowel syndrome (IBS) 10/21/2010  . Neck pain, chronic 10/20/2010  . Overweight(278.02) 10/21/2010  . Peripheral edema 10/21/2010  . Plantar fasciitis 10/21/2010  . 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 Recurrent sinusitis 10/21/2010  . Right shoulder pain 10/20/2010  . Tobacco abuse 10/21/2010  . Urge incontinence 10/21/2010    Patient Active Problem List   Diagnosis Date Noted  . Injury of left hand 05/19/2015  . Tobacco dependence 08/08/2012  . Peripheral neuropathy 08/07/2012  . Wrist  pain, chronic 08/07/2012  . Adult ADHD 08/07/2012  . Health maintenance examination 07/03/2012  . Suprapubic pain, acute 03/09/2011  . Insomnia 02/10/2011  . Fibromyalgia 10/21/2010  . Allergic rhinitis 10/21/2010  . Recurrent sinusitis 10/21/2010  . Irritable bowel syndrome (IBS) 10/21/2010  . Hyperlipidemia 10/21/2010  . Back pain 10/21/2010  . 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 Overweight(278.02) 10/21/2010  . Fatigue 10/21/2010  . Peripheral edema 10/21/2010  . Asthma, exercise induced 10/20/2010  . Depression 10/20/2010  . Right shoulder pain 10/20/2010  . Neck pain, chronic 10/20/2010    Past Surgical History:  Procedure Laterality Date  . ABDOMINAL HYSTERECTOMY  2004   for endometriosis  . CESAREAN SECTION  1988  . COLONOSCOPY     screening TCS planned  for 06/04/16 as of pt's 04/09/16 o/v with Dr. Watt Climes.  Marland Kitchen DILATION AND CURETTAGE OF UTERUS  1994 and 1995  . left foot surgery  2010  . left knee  2002  . left shoulder  2004  . NECK SURGERY  2003 & 2005  . NOSE SURGERY  14 and 51 yrs old  . right knee  1987  . shock wave on right foot  2009  . sinus surgeries  2005 &2007  . TONSILLECTOMY AND ADENOIDECTOMY  1974    OB History    No data available       Home Medications    Prior to Admission medications   Medication Sig Start Date End Date Taking?  Authorizing Provider  albuterol (VENTOLIN HFA) 108 (90 Base) MCG/ACT inhaler Inhale 2 puffs into the lungs every 6 (six) hours as needed for wheezing. 06/15/16  Yes Kuneff, Renee A, DO  meclizine (ANTIVERT) 25 MG tablet Take 1 tablet (25 mg total) by mouth 3 (three) times daily as needed for dizziness. Patient not taking: Reported on 11/23/2016 11/23/16   Howard Pouch A, DO  meclizine (ANTIVERT) 25 MG tablet Take 1 tablet (25 mg total) by mouth 3 (three) times daily as needed for dizziness. 11/24/16   Camilah Spillman C, PA-C  ondansetron (ZOFRAN) 4 MG tablet Take 1 tablet (4 mg total) by mouth every 8 (eight) hours as needed for  nausea or vomiting. Patient not taking: Reported on 11/23/2016 11/23/16   Howard Pouch A, DO  oxybutynin (DITROPAN-XL) 10 MG 24 hr tablet Take 1 tablet (10 mg total) by mouth at bedtime. Patient not taking: Reported on 11/23/2016 05/01/14   Tammi Sou, MD    Family History Family History  Problem Relation Age of Onset  . Hypertension Mother   . Diabetes Mother        borderline  . Asthma Mother   . Arthritis Mother   . Hypertension Father   . Arthritis Father   . Cancer Father        prostate  . Heart disease Father        s/p MI  . Asthma Daughter   . Migraines Daughter   . GER disease Daughter   . Cancer Son        CA- calcification, right lower ribs was causing significant breathing trouble after returning from [place]  . Heart murmur Son   . Heart disease Maternal Grandmother   . Diabetes Maternal Grandfather   . Cancer Paternal Grandmother        stomach  . Asthma Daughter   . COPD Brother        smoker, lung disease    Social History Social History  Substance Use Topics  . Smoking status: Current Some Day Smoker    Packs/day: 0.25    Types: Cigarettes  . Smokeless tobacco: Never Used  . Alcohol use Yes     Comment: occasional     Allergies   Codeine; Darvocet [propoxyphene n-acetaminophen]; Percocet [oxycodone-acetaminophen]; and Hydrocodone   Review of Systems Review of Systems  Constitutional: Negative for chills, diaphoresis and fever.  HENT: Negative for ear discharge.   Respiratory: Negative for shortness of breath.   Cardiovascular: Negative for chest pain.  Gastrointestinal: Positive for nausea. Negative for abdominal pain and vomiting.  Musculoskeletal: Negative for neck pain and neck stiffness.  Neurological: Positive for dizziness. Negative for syncope, weakness, numbness and headaches.  All other systems reviewed and are negative.    Physical Exam Updated Vital Signs BP 122/71 (BP Location: Right Arm)   Pulse 91   Temp 98 F (36.7  C) (Oral)   Resp 18   Ht 5\' 4"  (1.626 m)   Wt 86.6 kg (191 lb)   SpO2 96%   BMI 32.79 kg/m   Physical Exam  Constitutional: She is oriented to person, place, and time. She appears well-developed and well-nourished. No distress.  HENT:  Head: Normocephalic and atraumatic.  Eyes: Conjunctivae and EOM are normal. Pupils are equal, round, and reactive to light.  Neck: Neck supple.  Cardiovascular: Normal rate, regular rhythm, normal heart sounds and intact distal pulses.   Pulmonary/Chest: Effort normal and breath sounds normal. No respiratory distress.  Abdominal: Soft.  There is no tenderness. There is no guarding.  Musculoskeletal: She exhibits no edema.  Lymphadenopathy:    She has no cervical adenopathy.  Neurological: She is alert and oriented to person, place, and time.  No sensory deficits. Strength 5/5 in all extremities. No upright ataxia. Coordination intact including heel to shin and finger to nose. Cranial nerves III-XII grossly intact. No facial droop.   Skin: Skin is warm and dry. She is not diaphoretic.  Psychiatric: She has a normal mood and affect. Her behavior is normal.  Nursing note and vitals reviewed.    ED Treatments / Results  Labs (all labs ordered are listed, but only abnormal results are displayed) Labs Reviewed  BASIC METABOLIC PANEL  CBC  CBG MONITORING, ED    EKG  EKG Interpretation  Date/Time:  Tuesday November 23 2016 18:02:52 EDT Ventricular Rate:  83 PR Interval:    QRS Duration: 93 QT Interval:  384 QTC Calculation: 452 R Axis:   25 Text Interpretation:  Sinus rhythm Low voltage, extremity and precordial leads No acute ST elevations Confirmed by Duffy Bruce [phone]) on 11/23/2016 7:04:24 PM       Radiology Mr Brain Wo Contrast  Result Date: 11/23/2016 CLINICAL DATA:  Intermittent dizziness for week, nausea and blurry vision. History of hyperlipidemia, tobacco dependence. EXAM: MRI HEAD WITHOUT CONTRAST TECHNIQUE: Multiplanar,  multiecho pulse sequences of the brain and surrounding structures were obtained without intravenous contrast. COMPARISON:  None. FINDINGS: BRAIN: No reduced diffusion to suggest acute ischemia or hyperacute demyelination. No susceptibility artifact to suggest hemorrhage. The ventricles and sulci are normal for patient's age. No suspicious parenchymal signal, masses or mass effect. No abnormal extra-axial fluid collections. VASCULAR: Normal major intracranial vascular flow voids present at skull base. SKULL AND UPPER CERVICAL SPINE: No abnormal sellar expansion. No suspicious calvarial bone marrow signal. Craniocervical junction maintained. SINUSES/ORBITS: Mild paranasal sinus mucosal thickening. Small RIGHT maxillary mucosal retention cyst. Mastoid air cells are well aerated. The included ocular globes and orbital contents are non-suspicious. OTHER: None. IMPRESSION: Normal noncontrast MRI head. Electronically Signed   By: Elon Alas M.D.   On: 11/23/2016 23:54    Procedures Procedures (including critical care time)  Medications Ordered in ED Medications  LORazepam (ATIVAN) injection 1 mg (1 mg Intravenous Given 11/23/16 1951)  meclizine (ANTIVERT) tablet 25 mg (25 mg Oral Given 11/23/16 1955)  LORazepam (ATIVAN) injection 1 mg (1 mg Intravenous Given 11/23/16 2255)     Initial Impression / Assessment and Plan / ED Course  I have reviewed the triage vital signs and the nursing notes.  Pertinent labs & imaging results that were available during my care of the patient were reviewed by me and considered in my medical decision making (see chart for details).  Clinical Course as of Nov 24 540  Wed Nov 24, 2016  0045 Results were discussed with patient's spouse at the bedside. Patient is somnolent, but arousable.   [SJ]    Clinical Course User Index [SJ] Verena Shawgo C, PA-C    Patient presents with room spinning dizziness. MRI clear from acute abnormality. Voiced significant improvement with  meclizine. Ambulated without difficulty or assistance. Neurology versus ENT follow-up. The patient was given instructions for home care as well as strict return precautions. Patient voices understanding of these instructions, accepts the plan, and is comfortable with discharge.  Findings and plan of care discussed with Lacretia Leigh, MD.    Vitals:   11/23/16 2215 11/23/16 2230 11/23/16 2245 11/23/16 2359  BP: Marland Kitchen)  108/58 106/80 100/73 106/66  Pulse: 90 88 77 82  Resp:    16  Temp:      TempSrc:      SpO2: 96% 97% 96% 95%  Weight:      Height:         Final Clinical Impressions(s) / ED Diagnoses   Final diagnoses:  Dizziness  Vertigo    New Prescriptions Discharge Medication List as of 11/24/2016  1:23 AM    START taking these medications   Details  !! meclizine (ANTIVERT) 25 MG tablet Take 1 tablet (25 mg total) by mouth 3 (three) times daily as needed for dizziness., Starting Wed 11/24/2016, Print     !! - Potential duplicate medications found. Please discuss with provider.       Lorayne Bender, PA-C 11/24/16 [phone]

## 2016-11-24 ENCOUNTER — Telehealth: Payer: Self-pay | Admitting: Family Medicine

## 2016-11-24 MED ORDER — MECLIZINE HCL 25 MG PO TABS: 25.0000 mg | ORAL_TABLET | Freq: Three times a day (TID) | ORAL | 0 refills | Status: DC | PRN

## 2016-11-24 NOTE — Telephone Encounter (Signed)
Left detailed message with lab results on patient voice mail per [facility]

## 2016-11-24 NOTE — ED Notes (Signed)
Pt ambulated from room to EMS doors with no assistance and with no difficulty. Pt also had no complaints while ambulating just a little drowsy from Ativan. Brooke [employer] production planner and [place] PA notified

## 2016-11-24 NOTE — Discharge Instructions (Signed)
You have been seen today for dizziness. There were no acute abnormalities noted on MRI. You were able to walk without assistance here in the ED with resolved dizziness. Use the meclizine as needed for dizziness. Be sure to stay well-hydrated. Follow up with your primary care provider. Follow-up with the neurologist should symptoms continue. May also follow-up with ear nose and throat specialist for continued ear and/or sinus issues.  Return to the ED should symptoms worsen or as needed.

## 2016-11-24 NOTE — Telephone Encounter (Signed)
Please call pt: - I am glad to see her MRI was negative for stroke (went to ED).  - her labs we collected here returned and are stable.

## 2016-12-02 ENCOUNTER — Encounter: Payer: Self-pay | Admitting: Family Medicine

## 2017-02-03 ENCOUNTER — Encounter: Payer: Self-pay | Admitting: Family Medicine

## 2017-02-03 ENCOUNTER — Ambulatory Visit (INDEPENDENT_AMBULATORY_CARE_PROVIDER_SITE_OTHER): Payer: BC Managed Care – PPO | Admitting: Family Medicine

## 2017-02-03 VITALS — BP 123/81 | HR 94 | Temp 98.1°F | Resp 16 | Wt 194.0 lb

## 2017-02-03 DIAGNOSIS — H8110 Benign paroxysmal vertigo, unspecified ear: Secondary | ICD-10-CM

## 2017-02-03 NOTE — Patient Instructions (Addendum)
Buy generic over the counter meclizine and take as instructed on packaging---this can help with your sense of lightheadedness (but it will NOT help the spinning sensation).  Call if not significantly improved in 1 week, and I will arrange for vestibular physical therapy at that time.

## 2017-02-03 NOTE — Progress Notes (Signed)
OFFICE VISIT  02/03/2017   CC:  Chief Complaint  Patient presents with  . Dizziness  . Headache    HPI:    Patient is a 51 y.o. Caucasian female who presents for ongoing dizziness/vertigo. Was seen in this office by Dr. Raoul Pitch and was sent to ED for r/o CVA on 11/23/16 for ataxia/vertigo.  MRI brain in ED was normal.  Reviewed ED visit records. Labs here were stable.  Still having episodes: head position changes provoke vertigo sensation, some nausea sometimes.  She closes her eyes and holds on to something and it goes away in 30 sec up to 2 min.  Has episodes most days of the week. In between episodes she feels a bit of disequilibrium.  No tinnitus. No hearing deficit.  She has not been told about home epley maneuvers.  No vertigo episodes that are not provoked by head position change.  ROS: No stiff neck, no fevers, no n/v, no recent HAs.  No focal weakness or paresthesias.  Between episodes she does not feel like she is veering to the R or L when she walks.  No visual complaints.  NO palpitations, CP, or Sob  Past Medical History:  Diagnosis Date  . Adult ADHD 02/10/2011  . Allergic rhinitis 10/21/2010  . Arthritis   . Asthma 10/20/2010   Much worse as a child, only prn albut as adult  . Back pain 10/21/2010  . BCC (basal cell carcinoma), face 10/21/2010  . Depression 10/20/2010  . Endometriosis 10/21/2010  . Fatigue 10/21/2010  . Fibromyalgia   . Headache, migraine 10/21/2010  . Headache, tension-type 10/21/2010  . Hyperlipidemia, mixed    Trigs >600  . Insomnia 02/10/2011  . Irritable bowel syndrome (IBS) 10/21/2010  . Neck pain, chronic 10/20/2010  . Overweight(278.02) 10/21/2010  . Peripheral edema 10/21/2010  . Plantar fasciitis 10/21/2010  . 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 Recurrent sinusitis 10/21/2010  . Right shoulder pain 10/20/2010  . Tobacco abuse 10/21/2010  . Urge incontinence 10/21/2010  . Vertigo    following an episode of presyncope---ED eval 11/2016 ok, as was noncontrast brain MRI.     Past Surgical History:  Procedure Laterality Date  . ABDOMINAL HYSTERECTOMY  2004   for endometriosis  . CESAREAN SECTION  1988  . COLONOSCOPY     screening TCS planned  for 06/04/16 as of pt's 04/09/16 o/v with Dr. Watt Climes.  Marland Kitchen DILATION AND CURETTAGE OF UTERUS  1994 and 1995  . left foot surgery  2010  . left knee  2002  . left shoulder  2004  . NECK SURGERY  2003 & 2005  . NOSE SURGERY  14 and 51 yrs old  . right knee  1987  . shock wave on right foot  2009  . sinus surgeries  2005 &2007  . TONSILLECTOMY AND ADENOIDECTOMY  1974    Outpatient Medications Prior to Visit  Medication Sig Dispense Refill  . albuterol (VENTOLIN HFA) 108 (90 Base) MCG/ACT inhaler Inhale 2 puffs into the lungs every 6 (six) hours as needed for wheezing. 1 Inhaler 1  . meclizine (ANTIVERT) 25 MG tablet Take 1 tablet (25 mg total) by mouth 3 (three) times daily as needed for dizziness. (Patient not taking: Reported on 11/23/2016) 30 tablet 0  . meclizine (ANTIVERT) 25 MG tablet Take 1 tablet (25 mg total) by mouth 3 (three) times daily as needed for dizziness. (Patient not taking: Reported on 02/03/2017) 30 tablet 0  . ondansetron (ZOFRAN) 4 MG tablet Take 1  tablet (4 mg total) by mouth every 8 (eight) hours as needed for nausea or vomiting. (Patient not taking: Reported on 11/23/2016) 20 tablet 0  . oxybutynin (DITROPAN-XL) 10 MG 24 hr tablet Take 1 tablet (10 mg total) by mouth at bedtime. (Patient not taking: Reported on 11/23/2016) 30 tablet 6   No facility-administered medications prior to visit.     Allergies  Allergen Reactions  . Codeine Hives  . Darvocet [Propoxyphene N-Acetaminophen] Nausea And Vomiting  . Percocet [Oxycodone-Acetaminophen] Nausea And Vomiting  . Hydrocodone Nausea And Vomiting    ROS As per HPI  PE: Blood pressure 123/81, pulse 94, temperature 98.1 F (36.7 C), temperature source Oral, resp. rate 16, weight 194 lb (88 kg), SpO2 97 %.  Pt examined with Sharen Hones, CMA,  as chaperone.  Gen: Alert, well appearing.  Patient is oriented to person, place, time, and situation. AFFECT: pleasant, lucid thought and speech. CV: RRR, no m/r/g.   LUNGS: CTA bilat, nonlabored resps, good aeration in all lung fields. Neuro: CN 2-12 intact bilaterally, strength 5/5 in proximal and distal upper extremities and lower extremities bilaterally. No tremor.  FNF normal bilt.  No ataxia.  Upper extremity and lower extremity DTRs symmetric.  No pronator drift. Dix-Halpike maneuver NEG for vertigo or nystagmus bilat.  LABS:  Lab Results  Component Value Date   WBC 10.5 11/23/2016   HGB 13.3 11/23/2016   HCT 39.5 11/23/2016   MCV 85.3 11/23/2016   PLT 267 11/23/2016     Chemistry      Component Value Date/Time   NA 136 11/23/2016 1810   K 3.5 11/23/2016 1810   CL 104 11/23/2016 1810   CO2 24 11/23/2016 1810   BUN 14 11/23/2016 1810   CREATININE 0.85 11/23/2016 1810   CREATININE 0.91 11/23/2016 1635      Component Value Date/Time   CALCIUM 9.0 11/23/2016 1810   ALKPHOS 82 11/23/2016 1635   AST 14 11/23/2016 1635   ALT 12 11/23/2016 1635   BILITOT 0.2 11/23/2016 1635     Lab Results  Component Value Date   TSH 2.69 01/03/2015    IMPRESSION AND PLAN:  BPPV; recurrent and very problematic last 1-2 mo. Discussed home epley maneuvers and gave pt a handout demonstrating these. Do these 3 times a day until vertigo-free for 24h. Call in 1 week if not improved and I'll get her set up for vestibular rehab. Recommended meclizine trial to see if it helps with her mild disequalibrium feeling between episodes of vertigo.  An After Visit Summary was printed and given to the patient.  FOLLOW UP: Return for as needed.  Signed:  Crissie Sickles, MD           02/03/2017

## 2017-06-14 DIAGNOSIS — F17201 Nicotine dependence, unspecified, in remission: Secondary | ICD-10-CM

## 2017-06-14 HISTORY — DX: Nicotine dependence, unspecified, in remission: F17.201

## 2017-07-29 ENCOUNTER — Ambulatory Visit: Payer: BC Managed Care – PPO | Admitting: Family Medicine

## 2017-07-29 ENCOUNTER — Encounter: Payer: Self-pay | Admitting: Family Medicine

## 2017-07-29 VITALS — BP 139/76 | HR 78 | Temp 97.8°F | Resp 16 | Ht 64.0 in | Wt 200.0 lb

## 2017-07-29 DIAGNOSIS — J4521 Mild intermittent asthma with (acute) exacerbation: Secondary | ICD-10-CM

## 2017-07-29 DIAGNOSIS — J0100 Acute maxillary sinusitis, unspecified: Secondary | ICD-10-CM | POA: Diagnosis not present

## 2017-07-29 MED ORDER — ALBUTEROL SULFATE HFA 108 (90 BASE) MCG/ACT IN AERS: 2.0000 | INHALATION_SPRAY | Freq: Four times a day (QID) | RESPIRATORY_TRACT | 0 refills | Status: DC | PRN

## 2017-07-29 MED ORDER — AMOXICILLIN 875 MG PO TABS: 875.0000 mg | ORAL_TABLET | Freq: Two times a day (BID) | ORAL | 0 refills | Status: AC

## 2017-07-29 MED ORDER — PREDNISONE 20 MG PO TABS: ORAL_TABLET | ORAL | 0 refills | Status: DC

## 2017-07-29 NOTE — Progress Notes (Signed)
OFFICE VISIT  07/29/2017   CC:  Chief Complaint  Patient presents with  . URI   HPI:    Patient is a 52 y.o. Caucasian female former cigarette smoker (quit 05/2017) with mild intermittent/exercise induced asthma who presents for "sinus" sx's. Onset about 2-3 wks ago, bad cough, URI sx's, body aches/chills. She got better over a week and then milder sx's have continued. ST, cough, ear pain, fullness in nasal/sinus regions, SOB with walking, some HAs.  No wheezing.  No fevers.  Fatigue but no body aches. She has lost her albut inhaler. Taking mucinex D.  Flonase use--no help. Rare use of afrin lately does help.   Past Medical History:  Diagnosis Date  . Adult ADHD 02/10/2011  . Allergic rhinitis 10/21/2010  . Arthritis   . Asthma 10/20/2010   Much worse as a child, only prn albut as adult  . Back pain 10/21/2010  . BCC (basal cell carcinoma), face 10/21/2010  . Depression 10/20/2010  . Endometriosis 10/21/2010  . Fatigue 10/21/2010  . Fibromyalgia   . Headache, migraine 10/21/2010  . Headache, tension-type 10/21/2010  . Hyperlipidemia, mixed    Trigs >600  . Insomnia 02/10/2011  . Irritable bowel syndrome (IBS) 10/21/2010  . Neck pain, chronic 10/20/2010  . Overweight(278.02) 10/21/2010  . Peripheral edema 10/21/2010  . Plantar fasciitis 10/21/2010  . 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 Recurrent sinusitis 10/21/2010  . Right shoulder pain 10/20/2010  . Tobacco abuse 10/21/2010  . Urge incontinence 10/21/2010  . Vertigo    following an episode of presyncope---ED eval 11/2016 ok, as was noncontrast brain MRI.    Past Surgical History:  Procedure Laterality Date  . ABDOMINAL HYSTERECTOMY  2004   for endometriosis  . CESAREAN SECTION  1988  . COLONOSCOPY     screening TCS planned  for 06/04/16 as of pt's 04/09/16 o/v with Dr. Watt Climes.  Marland Kitchen DILATION AND CURETTAGE OF UTERUS  1994 and 1995  . left foot surgery  2010  . left knee  2002  . left shoulder  2004  . NECK SURGERY  2003 & 2005  . NOSE SURGERY  14 and 52  yrs old  . right knee  1987  . shock wave on right foot  2009  . sinus surgeries  2005 &2007  . TONSILLECTOMY AND ADENOIDECTOMY  1974    Outpatient Medications Prior to Visit  Medication Sig Dispense Refill  . OVER THE COUNTER MEDICATION CBD drops    . albuterol (VENTOLIN HFA) 108 (90 Base) MCG/ACT inhaler Inhale 2 puffs into the lungs every 6 (six) hours as needed for wheezing. 1 Inhaler 1   No facility-administered medications prior to visit.     Allergies  Allergen Reactions  . Codeine Hives  . Darvocet [Propoxyphene N-Acetaminophen] Nausea And Vomiting  . Percocet [Oxycodone-Acetaminophen] Nausea And Vomiting  . Hydrocodone Nausea And Vomiting    ROS As per HPI  PE: Blood pressure 139/76, pulse 78, temperature 97.8 F (36.6 C), temperature source Oral, resp. rate 16, height 5\' 4"  (1.626 m), weight 200 lb (90.7 kg), SpO2 98 %. VS: noted--normal. Gen: alert, NAD, NONTOXIC APPEARING. HEENT: eyes without injection, drainage, or swelling.  Ears: EACs clear, TMs with normal light reflex and landmarks.  Nose: Clear rhinorrhea, with some dried, crusty exudate adherent to mildly injected mucosa.  No purulent d/c.  L>R paranasal sinus TTP.  No facial swelling.  Throat and mouth without focal lesion.  No pharyngial swelling, erythema, or exudate.   Neck: supple, no  LAD.   LUNGS: CTA bilat, nonlabored resps.  Prolonged exp phase and trace end exp wheeze with forced exhalation. CV: RRR, no m/r/g. EXT: no c/c/e SKIN: no rash   LABS:    Chemistry      Component Value Date/Time   NA 136 11/23/2016 1810   K 3.5 11/23/2016 1810   CL 104 11/23/2016 1810   CO2 24 11/23/2016 1810   BUN 14 11/23/2016 1810   CREATININE 0.85 11/23/2016 1810   CREATININE 0.91 11/23/2016 1635      Component Value Date/Time   CALCIUM 9.0 11/23/2016 1810   ALKPHOS 82 11/23/2016 1635   AST 14 11/23/2016 1635   ALT 12 11/23/2016 1635   BILITOT 0.2 11/23/2016 1635       IMPRESSION AND PLAN:  Acute  sinusitis with acute exacerbation of mild intermittent asthma. Amoxil 875mg  bid x 10d. Prednisone 40mg  qd x 5d, then 20mg  qd x 5d. Renewed albuterol HFA rx. It is good that she has quit smoking. Get otc generic robitussin DM OR Mucinex DM and use as directed on the packaging for cough and congestion. Use otc generic saline nasal spray 2-3 times per day to irrigate/moisturize your nasal passages.  An After Visit Summary was printed and given to the patient.  FOLLOW UP: Return if symptoms worsen or fail to improve.  Signed:  Crissie Sickles, MD           07/29/2017

## 2017-07-29 NOTE — Patient Instructions (Signed)
Get otc generic robitussin DM OR Mucinex DM and use as directed on the packaging for cough and congestion. Use otc generic saline nasal spray 2-3 times per day to irrigate/moisturize your nasal passages.   

## 2017-08-04 ENCOUNTER — Ambulatory Visit: Payer: Self-pay | Admitting: *Deleted

## 2017-08-04 NOTE — Telephone Encounter (Signed)
Summary: chest cold/sinus infection   Patient has 3 days left of treatment for sinus and upper resp infection, and thinks she should be feeling better already. Wants to know if she needs to finish the meds.    Returned call to patient to talk to her regarding her symptoms.  Advised patient to finish up her antibiotic and prednisone and let the office know how she is doing. Offered to make an appointment for tomorrow, she declined. She wanted to wait and see how she does over the weekend.

## 2017-08-17 ENCOUNTER — Ambulatory Visit: Payer: BC Managed Care – PPO | Admitting: Family Medicine

## 2017-08-17 ENCOUNTER — Encounter: Payer: Self-pay | Admitting: Family Medicine

## 2017-08-17 VITALS — BP 118/79 | HR 90 | Temp 98.6°F | Resp 20 | Ht 64.0 in | Wt 199.2 lb

## 2017-08-17 DIAGNOSIS — J329 Chronic sinusitis, unspecified: Secondary | ICD-10-CM | POA: Diagnosis not present

## 2017-08-17 DIAGNOSIS — R51 Headache: Secondary | ICD-10-CM

## 2017-08-17 DIAGNOSIS — R519 Headache, unspecified: Secondary | ICD-10-CM

## 2017-08-17 MED ORDER — DOXYCYCLINE HYCLATE 100 MG PO TABS: 100.0000 mg | ORAL_TABLET | Freq: Two times a day (BID) | ORAL | 0 refills | Status: DC

## 2017-08-17 MED ORDER — KETOROLAC TROMETHAMINE 60 MG/2ML IM SOLN
60.0000 mg | Freq: Once | INTRAMUSCULAR | Status: AC
  Administered 2017-08-17: 60 mg via INTRAMUSCULAR

## 2017-08-17 MED ORDER — PREDNISONE 20 MG PO TABS: ORAL_TABLET | ORAL | 0 refills | Status: DC

## 2017-08-17 NOTE — Progress Notes (Signed)
Hannah Garrett , 1965/11/07, 52 y.o., female MRN: [id number] Patient Care Team    Relationship Specialty Notifications Start End  McGowen, Adrian Blackwater, MD PCP - General Family Medicine  06/23/12   Jovita Gamma, MD Consulting Physician Neurosurgery  09/22/12   Molli Posey, MD Consulting Physician Obstetrics and Gynecology  04/13/16   Clarene Essex, MD Consulting Physician Gastroenterology  04/19/16     Chief Complaint  Patient presents with  . URI    x 5 weeks facial and ear pressure headache, gums hurt     Subjective: Pt presents for an OV with complaints of facial pressure, ear pressure, headache, fever of 5 weeks duration.  Associated symptoms include teeth pain. She was treated with amoxil and prednisone 3 weeks ago with mild improvement. Recently worsening over the last few days. She has restart ed mucinex. She was sent home from work today because of a headache.   No flowsheet data found.  Allergies  Allergen Reactions  . Codeine Hives  . Darvocet [Propoxyphene N-Acetaminophen] Nausea And Vomiting  . Percocet [Oxycodone-Acetaminophen] Nausea And Vomiting  . Hydrocodone Nausea And Vomiting   Social History   Tobacco Use  . Smoking status: Former Smoker    Packs/day: 1.00    Years: 30.00    Pack years: 30.00    Types: Cigarettes    Last attempt to quit: 05/16/2017    Years since quitting: 0.2  . Smokeless tobacco: Never Used  Substance Use Topics  . Alcohol use: Yes    Comment: occasional   Past Medical History:  Diagnosis Date  . Adult ADHD 02/10/2011  . Allergic rhinitis 10/21/2010  . Arthritis   . Asthma 10/20/2010   Much worse as a child, only prn albut as adult  . Back pain 10/21/2010  . BCC (basal cell carcinoma), face 10/21/2010  . Depression 10/20/2010  . Endometriosis 10/21/2010  . Fatigue 10/21/2010  . Fibromyalgia   . Headache, migraine 10/21/2010  . Headache, tension-type 10/21/2010  . Hyperlipidemia, mixed    Trigs >600  . Insomnia 02/10/2011  . Irritable  bowel syndrome (IBS) 10/21/2010  . Neck pain, chronic 10/20/2010  . Overweight(278.02) 10/21/2010  . Peripheral edema 10/21/2010  . Plantar fasciitis 10/21/2010  . 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 Recurrent sinusitis 10/21/2010  . Right shoulder pain 10/20/2010  . Tobacco abuse, in remission 2019   Quit 05/2017  . Urge incontinence 10/21/2010  . Vertigo    following an episode of presyncope---ED eval 11/2016 ok, as was noncontrast brain MRI.   Past Surgical History:  Procedure Laterality Date  . ABDOMINAL HYSTERECTOMY  2004   for endometriosis  . CESAREAN SECTION  1988  . COLONOSCOPY     screening TCS planned  for 06/04/16 as of pt's 04/09/16 o/v with Dr. Watt Climes.  Marland Kitchen DILATION AND CURETTAGE OF UTERUS  1994 and 1995  . left foot surgery  2010  . left knee  2002  . left shoulder  2004  . NECK SURGERY  2003 & 2005  . NOSE SURGERY  14 and 52 yrs old  . right knee  1987  . shock wave on right foot  2009  . sinus surgeries  2005 &2007  . TONSILLECTOMY AND ADENOIDECTOMY  1974   Family History  Problem Relation Age of Onset  . Hypertension Mother   . Diabetes Mother        borderline  . Asthma Mother   . Arthritis Mother   . Hypertension Father   .  Arthritis Father   . Cancer Father        prostate  . Heart disease Father        s/p MI  . Asthma Daughter   . Migraines Daughter   . GER disease Daughter   . Cancer Son        CA- calcification, right lower ribs was causing significant breathing trouble after returning from [place]  . Heart murmur Son   . Heart disease Maternal Grandmother   . Diabetes Maternal Grandfather   . Cancer Paternal Grandmother        stomach  . Asthma Daughter   . COPD Brother        smoker, lung disease   Allergies as of 08/17/2017      Reactions   Codeine Hives   Darvocet [propoxyphene N-acetaminophen] Nausea And Vomiting   Percocet [oxycodone-acetaminophen] Nausea And Vomiting   Hydrocodone Nausea And Vomiting      Medication List        Accurate as of 08/17/17   2:14 PM. Always use your most recent med list.          albuterol 108 (90 Base) MCG/ACT inhaler Commonly known as:  VENTOLIN HFA Inhale 2 puffs into the lungs every 6 (six) hours as needed for wheezing or shortness of breath.   doxycycline 100 MG tablet Commonly known as:  VIBRA-TABS Take 1 tablet (100 mg total) by mouth 2 (two) times daily.   OVER THE COUNTER MEDICATION CBD drops   predniSONE 20 MG tablet Commonly known as:  DELTASONE 60 mg x3d, 40 mg x3d, 20 mg x2d, 10 mg x2d       All past medical history, surgical history, allergies, family history, immunizations andmedications were updated in the EMR today and reviewed under the history and medication portions of their EMR.     ROS: Negative, with the exception of above mentioned in HPI   Objective:  BP 118/79 (BP Location: Left Arm, Patient Position: Sitting, Cuff Size: Large)   Pulse 90   Temp 98.6 F (37 C)   Resp 20   Ht 5\' 4"  (1.626 m)   Wt 199 lb 4 oz (90.4 kg)   SpO2 96%   BMI 34.20 kg/m  Body mass index is 34.2 kg/m. Gen: Afebrile. No acute distress. Nontoxic in appearance, well developed, well nourished.  HENT: AT. [place]. Bilateral TM visualized with bilateral fullness. MMM, no oral lesions. Bilateral nares with erythema and drainage. Throat without erythema or exudates. TTP frontal sinus.  Eyes:Pupils Equal Round Reactive to light, Extraocular movements intact,  Conjunctiva without redness, discharge or icterus. Neck/lymp/endocrine: Supple,bilateral ant lymphadenopathy CV: RRR  Chest: CTAB, no wheeze or crackles. Good air movement, normal resp effort.  Abd: Soft. NTND. BS present.  Skin: no rashes, purpura or petechiae.  Neuro:  Normal gait. PERLA. EOMi. Alert. Oriented x3  No exam data present No results found. No results found for this or any previous visit (from the past 24 hour(s)).  Assessment/Plan: Hannah Garrett is a 52 y.o. female present for OV for   Recurrent sinusitis Rest, hydrate.  +  flonase, mucinex (DM if cough), nettie pot or nasal saline.  Doxy start today . pred taper start tomorrow prescribed, take abx F/U 2 weeks if not improved.  F/u PRN   Nonintractable headache, unspecified chronicity pattern, unspecified headache type toradol injection provided today to help try to break the cycle. Pt instructed to stop all NSAIDS today.    Reviewed expectations re:  course of current medical issues.  Discussed self-management of symptoms.  Outlined signs and symptoms indicating need for more acute intervention.  Patient verbalized understanding and all questions were answered.  Patient received an After-Visit Summary.    No orders of the defined types were placed in this encounter.    Note is dictated utilizing voice recognition software. Although note has been proof read prior to signing, occasional typographical errors still can be missed. If any questions arise, please do not hesitate to call for verification.   electronically signed by:  Howard Pouch, DO  [place]

## 2017-08-17 NOTE — Patient Instructions (Signed)
Rest, hydrate.  + flonase, mucinex (DM if cough), nettie pot or nasal saline.  doxycyline prescribed, take until completed.  Prednisone taper start tomorrow.  If cough present it can last up to 6-8 weeks.  F/U 2 weeks of not improved.    Sinusitis, Adult Sinusitis is soreness and inflammation of your sinuses. Sinuses are hollow spaces in the bones around your face. They are located:  Around your eyes.  In the middle of your forehead.  Behind your nose.  In your cheekbones.  Your sinuses and nasal passages are lined with a stringy fluid (mucus). Mucus normally drains out of your sinuses. When your nasal tissues get inflamed or swollen, the mucus can get trapped or blocked so air cannot flow through your sinuses. This lets bacteria, viruses, and funguses grow, and that leads to infection. Follow these instructions at home: Medicines  Take, use, or apply over-the-counter and prescription medicines only as told by your doctor. These may include nasal sprays.  If you were prescribed an antibiotic medicine, take it as told by your doctor. Do not stop taking the antibiotic even if you start to feel better. Hydrate and Humidify  Drink enough water to keep your pee (urine) clear or pale yellow.  Use a cool mist humidifier to keep the humidity level in your home above 50%.  Breathe in steam for 10-15 minutes, 3-4 times a day or as told by your doctor. You can do this in the bathroom while a hot shower is running.  Try not to spend time in cool or dry air. Rest  Rest as much as possible.  Sleep with your head raised (elevated).  Make sure to get enough sleep each night. General instructions  Put a warm, moist washcloth on your face 3-4 times a day or as told by your doctor. This will help with discomfort.  Wash your hands often with soap and water. If there is no soap and water, use hand sanitizer.  Do not smoke. Avoid being around people who are smoking (secondhand  smoke).  Keep all follow-up visits as told by your doctor. This is important. Contact a doctor if:  You have a fever.  Your symptoms get worse.  Your symptoms do not get better within 10 days. Get help right away if:  You have a very bad headache.  You cannot stop throwing up (vomiting).  You have pain or swelling around your face or eyes.  You have trouble seeing.  You feel confused.  Your neck is stiff.  You have trouble breathing. This information is not intended to replace advice given to you by your health care provider. Make sure you discuss any questions you have with your health care provider. Document Released: 11/17/2007 Document Revised: 01/25/2016 Document Reviewed: 03/26/2015 Elsevier Interactive Patient Education  [employer].

## 2017-08-29 ENCOUNTER — Encounter: Payer: Self-pay | Admitting: Family Medicine

## 2017-08-29 ENCOUNTER — Ambulatory Visit: Payer: BC Managed Care – PPO | Admitting: Family Medicine

## 2017-08-29 ENCOUNTER — Encounter: Payer: Self-pay | Admitting: *Deleted

## 2017-08-29 VITALS — BP 120/84 | HR 96 | Temp 98.1°F | Resp 16 | Ht 64.0 in | Wt 201.2 lb

## 2017-08-29 DIAGNOSIS — J329 Chronic sinusitis, unspecified: Secondary | ICD-10-CM | POA: Diagnosis not present

## 2017-08-29 MED ORDER — CLINDAMYCIN HCL 300 MG PO CAPS: 300.0000 mg | ORAL_CAPSULE | Freq: Three times a day (TID) | ORAL | 0 refills | Status: DC

## 2017-08-29 NOTE — Patient Instructions (Signed)
Make a follow up appointment to see your ENT (Dr. Redmond Baseman).

## 2017-08-29 NOTE — Progress Notes (Signed)
OFFICE VISIT  08/29/2017   CC:  Chief Complaint  Patient presents with  . URI   HPI:    Patient is a 52 y.o. Caucasian female who presents for ongoing respiratory symptoms. Waxing and waning URI sx's for 6+ wks.  I saw her 07/29/17 and rx'd amoxil and prednis for sinusitis/bronchitis. Was seen 02/17/18 here and dx'd with recurrent sinusitis and treated with doxy and steroid taper. She does have a hx of multiple sinus surgeries.  Still very persistent nasal/sinus congestion, thick and discolored nasal mucous, R nostril significantly worse than L. R side facial pain and upper teeth pain present.  No fevers. Just a bit of cough.  +ST, +HA.  No SOB, no wheezing.  Past Medical History:  Diagnosis Date  . Adult ADHD 02/10/2011  . Allergic rhinitis 10/21/2010  . Arthritis   . Asthma 10/20/2010   Much worse as a child, only prn albut as adult  . Back pain 10/21/2010  . BCC (basal cell carcinoma), face 10/21/2010  . Depression 10/20/2010  . Endometriosis 10/21/2010  . Fatigue 10/21/2010  . Fibromyalgia   . Headache, migraine 10/21/2010  . Headache, tension-type 10/21/2010  . Hyperlipidemia, mixed    Trigs >600  . Insomnia 02/10/2011  . Irritable bowel syndrome (IBS) 10/21/2010  . Neck pain, chronic 10/20/2010  . Overweight(278.02) 10/21/2010  . Peripheral edema 10/21/2010  . Plantar fasciitis 10/21/2010  . 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 Recurrent sinusitis 10/21/2010  . Right shoulder pain 10/20/2010  . Tobacco abuse, in remission 2019   Quit 05/2017  . Urge incontinence 10/21/2010  . Vertigo    following an episode of presyncope---ED eval 11/2016 ok, as was noncontrast brain MRI.    Past Surgical History:  Procedure Laterality Date  . ABDOMINAL HYSTERECTOMY  2004   for endometriosis  . CESAREAN SECTION  1988  . COLONOSCOPY     screening TCS planned  for 06/04/16 as of pt's 04/09/16 o/v with Dr. Watt Climes.  Marland Kitchen DILATION AND CURETTAGE OF UTERUS  1994 and 1995  . left foot surgery  2010  . left knee  2002  . left  shoulder  2004  . NECK SURGERY  2003 & 2005  . NOSE SURGERY  14 and 52 yrs old  . right knee  1987  . shock wave on right foot  2009  . sinus surgeries  2005 &2007  . TONSILLECTOMY AND ADENOIDECTOMY  1974    Outpatient Medications Prior to Visit  Medication Sig Dispense Refill  . albuterol (VENTOLIN HFA) 108 (90 Base) MCG/ACT inhaler Inhale 2 puffs into the lungs every 6 (six) hours as needed for wheezing or shortness of breath. 1 Inhaler 0  . OVER THE COUNTER MEDICATION CBD drops    . doxycycline (VIBRA-TABS) 100 MG tablet Take 1 tablet (100 mg total) by mouth 2 (two) times daily. (Patient not taking: Reported on 08/29/2017) 20 tablet 0  . predniSONE (DELTASONE) 20 MG tablet 60 mg x3d, 40 mg x3d, 20 mg x2d, 10 mg x2d (Patient not taking: Reported on 08/29/2017) 18 tablet 0   No facility-administered medications prior to visit.     Allergies  Allergen Reactions  . Codeine Hives  . Darvocet [Propoxyphene N-Acetaminophen] Nausea And Vomiting  . Percocet [Oxycodone-Acetaminophen] Nausea And Vomiting  . Hydrocodone Nausea And Vomiting    ROS As per HPI  PE: Blood pressure 120/84, pulse 96, temperature 98.1 F (36.7 C), temperature source Oral, resp. rate 16, height 5\' 4"  (1.626 m), weight 201 lb 4  oz (91.3 kg), SpO2 98 %. VS: noted--normal. Gen: alert, NAD, NONTOXIC APPEARING. HEENT: eyes without injection, drainage, or swelling.  Ears: EACs clear, TMs with normal light reflex and landmarks.  Nose: Clear rhinorrhea, with some dried, crusty exudate adherent to mildly injected and edematous mucosa.  No purulent d/c.  Minimal bilat paranasal sinus TTP.  No facial swelling.  Throat and mouth without focal lesion.  No pharyngial swelling, erythema, or exudate.   Neck: supple, no LAD.   LUNGS: CTA bilat, nonlabored resps.   CV: RRR, no m/r/g. EXT: no c/c/e SKIN: no rash  LABS:  None today  IMPRESSION AND PLAN:  Recurrent sinusitis: amoxil and doxy over the last 4-6 wks no  help. Rx'd clindamycin 300 mg tid x 10d today. Continue sinus rinses. Encouraged pt to make f/u appt with her ENT MD, Dr. Redmond Baseman, to consider next step in imaging/treatment.  An After Visit Summary was printed and given to the patient.  FOLLOW UP: Return if symptoms worsen or fail to improve.  Signed:  Crissie Sickles, MD           08/29/2017

## 2017-10-10 ENCOUNTER — Encounter (HOSPITAL_BASED_OUTPATIENT_CLINIC_OR_DEPARTMENT_OTHER): Payer: Self-pay | Admitting: *Deleted

## 2017-10-10 ENCOUNTER — Other Ambulatory Visit: Payer: Self-pay

## 2017-10-17 ENCOUNTER — Other Ambulatory Visit: Payer: Self-pay | Admitting: Otolaryngology

## 2017-11-17 ENCOUNTER — Encounter (HOSPITAL_BASED_OUTPATIENT_CLINIC_OR_DEPARTMENT_OTHER): Payer: Self-pay | Admitting: *Deleted

## 2017-11-17 ENCOUNTER — Other Ambulatory Visit: Payer: Self-pay

## 2017-11-28 ENCOUNTER — Ambulatory Visit (HOSPITAL_BASED_OUTPATIENT_CLINIC_OR_DEPARTMENT_OTHER): Payer: BC Managed Care – PPO | Admitting: Certified Registered Nurse Anesthetist

## 2017-11-28 ENCOUNTER — Ambulatory Visit (HOSPITAL_BASED_OUTPATIENT_CLINIC_OR_DEPARTMENT_OTHER)
Admission: RE | Admit: 2017-11-28 | Discharge: 2017-11-28 | Disposition: A | Discharge status: Home / Self Care | Payer: BC Managed Care – PPO | Source: Ambulatory Visit | Attending: Otolaryngology | Admitting: Otolaryngology

## 2017-11-28 ENCOUNTER — Encounter (HOSPITAL_BASED_OUTPATIENT_CLINIC_OR_DEPARTMENT_OTHER)
Admission: RE | Disposition: A | Discharge status: Home / Self Care | Payer: Self-pay | Source: Ambulatory Visit | Attending: Otolaryngology

## 2017-11-28 ENCOUNTER — Encounter (HOSPITAL_BASED_OUTPATIENT_CLINIC_OR_DEPARTMENT_OTHER): Payer: Self-pay | Admitting: *Deleted

## 2017-11-28 DIAGNOSIS — J320 Chronic maxillary sinusitis: Secondary | ICD-10-CM | POA: Diagnosis not present

## 2017-11-28 DIAGNOSIS — Z85828 Personal history of other malignant neoplasm of skin: Secondary | ICD-10-CM | POA: Diagnosis not present

## 2017-11-28 DIAGNOSIS — J343 Hypertrophy of nasal turbinates: Secondary | ICD-10-CM | POA: Insufficient documentation

## 2017-11-28 DIAGNOSIS — J329 Chronic sinusitis, unspecified: Secondary | ICD-10-CM | POA: Diagnosis present

## 2017-11-28 DIAGNOSIS — J322 Chronic ethmoidal sinusitis: Secondary | ICD-10-CM | POA: Diagnosis not present

## 2017-11-28 DIAGNOSIS — J3489 Other specified disorders of nose and nasal sinuses: Secondary | ICD-10-CM | POA: Diagnosis not present

## 2017-11-28 DIAGNOSIS — J45909 Unspecified asthma, uncomplicated: Secondary | ICD-10-CM | POA: Insufficient documentation

## 2017-11-28 HISTORY — PX: ETHMOIDECTOMY: SHX5197

## 2017-11-28 HISTORY — PX: SINUS ENDO WITH FUSION: SHX5329

## 2017-11-28 HISTORY — DX: Anxiety disorder, unspecified: F41.9

## 2017-11-28 HISTORY — PX: FRONTAL SINUS EXPLORATION: SHX6591

## 2017-11-28 HISTORY — PX: NASAL TURBINATE REDUCTION: SHX2072

## 2017-11-28 HISTORY — PX: MAXILLARY ANTROSTOMY: SHX2003

## 2017-11-28 SURGERY — SURGERY, PARANASAL SINUS, ENDOSCOPIC, WITH NASAL SEPTOPLASTY, TURBINOPLASTY, AND MAXILLARY SINUSOTOMY
Anesthesia: General | Site: Nose

## 2017-11-28 MED ORDER — PROPOFOL 10 MG/ML IV BOLUS
INTRAVENOUS | Status: AC
  Filled 2017-11-28: qty 20

## 2017-11-28 MED ORDER — MIDAZOLAM HCL 2 MG/2ML IJ SOLN: 1.0000 mg | INTRAMUSCULAR | Status: DC | PRN

## 2017-11-28 MED ORDER — PROPOFOL 10 MG/ML IV BOLUS
INTRAVENOUS | Status: DC | PRN
  Administered 2017-11-28: 140 mg via INTRAVENOUS

## 2017-11-28 MED ORDER — MIDAZOLAM HCL 2 MG/2ML IJ SOLN
INTRAMUSCULAR | Status: DC | PRN
  Administered 2017-11-28: 2 mg via INTRAVENOUS

## 2017-11-28 MED ORDER — LACTATED RINGERS IV SOLN: INTRAVENOUS | Status: DC

## 2017-11-28 MED ORDER — FENTANYL CITRATE (PF) 100 MCG/2ML IJ SOLN
INTRAMUSCULAR | Status: AC
  Filled 2017-11-28: qty 2

## 2017-11-28 MED ORDER — HYDROMORPHONE HCL 1 MG/ML IJ SOLN
0.2500 mg | INTRAMUSCULAR | Status: DC | PRN
  Administered 2017-11-28: 0.25 mg via INTRAVENOUS

## 2017-11-28 MED ORDER — LIDOCAINE HCL (CARDIAC) PF 100 MG/5ML IV SOSY
PREFILLED_SYRINGE | INTRAVENOUS | Status: AC
  Filled 2017-11-28: qty 5

## 2017-11-28 MED ORDER — LACTATED RINGERS IV SOLN
INTRAVENOUS | Status: DC
  Administered 2017-11-28 (×2): via INTRAVENOUS

## 2017-11-28 MED ORDER — EPHEDRINE SULFATE 50 MG/ML IJ SOLN
INTRAMUSCULAR | Status: AC
  Filled 2017-11-28: qty 1

## 2017-11-28 MED ORDER — MEPERIDINE HCL 25 MG/ML IJ SOLN: 6.2500 mg | INTRAMUSCULAR | Status: DC | PRN

## 2017-11-28 MED ORDER — KETOROLAC TROMETHAMINE 30 MG/ML IJ SOLN
INTRAMUSCULAR | Status: AC
  Filled 2017-11-28: qty 1

## 2017-11-28 MED ORDER — ONDANSETRON HCL 4 MG/2ML IJ SOLN
INTRAMUSCULAR | Status: DC | PRN
  Administered 2017-11-28 (×2): 4 mg via INTRAVENOUS

## 2017-11-28 MED ORDER — PROMETHAZINE HCL 25 MG/ML IJ SOLN: 6.2500 mg | INTRAMUSCULAR | Status: DC | PRN

## 2017-11-28 MED ORDER — HYDROCODONE-ACETAMINOPHEN 5-325 MG PO TABS: 1.0000 | ORAL_TABLET | Freq: Four times a day (QID) | ORAL | 0 refills | Status: DC | PRN

## 2017-11-28 MED ORDER — SCOPOLAMINE 1 MG/3DAYS TD PT72
1.0000 | MEDICATED_PATCH | Freq: Once | TRANSDERMAL | Status: DC | PRN
  Administered 2017-11-28: 1.5 mg via TRANSDERMAL

## 2017-11-28 MED ORDER — FENTANYL CITRATE (PF) 100 MCG/2ML IJ SOLN: 50.0000 ug | INTRAMUSCULAR | Status: DC | PRN

## 2017-11-28 MED ORDER — MUPIROCIN 2 % EX OINT
TOPICAL_OINTMENT | CUTANEOUS | Status: DC | PRN
  Administered 2017-11-28: 1 via NASAL

## 2017-11-28 MED ORDER — OXYMETAZOLINE HCL 0.05 % NA SOLN
NASAL | Status: DC | PRN
  Administered 2017-11-28: 1

## 2017-11-28 MED ORDER — LIDOCAINE 2% (20 MG/ML) 5 ML SYRINGE
INTRAMUSCULAR | Status: DC | PRN
  Administered 2017-11-28: 80 mg via INTRAVENOUS

## 2017-11-28 MED ORDER — AMOXICILLIN-POT CLAVULANATE 875-125 MG PO TABS: 1.0000 | ORAL_TABLET | Freq: Two times a day (BID) | ORAL | 0 refills | Status: AC

## 2017-11-28 MED ORDER — CEFAZOLIN SODIUM-DEXTROSE 2-4 GM/100ML-% IV SOLN
2.0000 g | INTRAVENOUS | Status: AC
  Administered 2017-11-28: 2 g via INTRAVENOUS

## 2017-11-28 MED ORDER — SCOPOLAMINE 1 MG/3DAYS TD PT72
MEDICATED_PATCH | TRANSDERMAL | Status: AC
  Filled 2017-11-28: qty 1

## 2017-11-28 MED ORDER — SUGAMMADEX SODIUM 200 MG/2ML IV SOLN
INTRAVENOUS | Status: DC | PRN
  Administered 2017-11-28: 200 mg via INTRAVENOUS

## 2017-11-28 MED ORDER — OXYCODONE HCL 5 MG PO TABS: 5.0000 mg | ORAL_TABLET | Freq: Once | ORAL | Status: DC | PRN

## 2017-11-28 MED ORDER — DEXAMETHASONE SODIUM PHOSPHATE 10 MG/ML IJ SOLN
INTRAMUSCULAR | Status: DC | PRN
  Administered 2017-11-28: 10 mg via INTRAVENOUS

## 2017-11-28 MED ORDER — FENTANYL CITRATE (PF) 100 MCG/2ML IJ SOLN
INTRAMUSCULAR | Status: DC | PRN
  Administered 2017-11-28: 100 ug via INTRAVENOUS

## 2017-11-28 MED ORDER — LIDOCAINE-EPINEPHRINE 1 %-1:100000 IJ SOLN
INTRAMUSCULAR | Status: AC
  Filled 2017-11-28: qty 1

## 2017-11-28 MED ORDER — PHENYLEPHRINE 40 MCG/ML (10ML) SYRINGE FOR IV PUSH (FOR BLOOD PRESSURE SUPPORT)
PREFILLED_SYRINGE | INTRAVENOUS | Status: DC | PRN
  Administered 2017-11-28: 120 ug via INTRAVENOUS

## 2017-11-28 MED ORDER — OXYCODONE HCL 5 MG/5ML PO SOLN: 5.0000 mg | Freq: Once | ORAL | Status: DC | PRN

## 2017-11-28 MED ORDER — PHENYLEPHRINE 40 MCG/ML (10ML) SYRINGE FOR IV PUSH (FOR BLOOD PRESSURE SUPPORT)
PREFILLED_SYRINGE | INTRAVENOUS | Status: AC
Start: 2017-11-28 — End: ?
  Filled 2017-11-28: qty 10

## 2017-11-28 MED ORDER — MUPIROCIN 2 % EX OINT
TOPICAL_OINTMENT | CUTANEOUS | Status: AC
  Filled 2017-11-28: qty 22

## 2017-11-28 MED ORDER — OXYMETAZOLINE HCL 0.05 % NA SOLN
NASAL | Status: AC
  Filled 2017-11-28: qty 15

## 2017-11-28 MED ORDER — ONDANSETRON HCL 4 MG/2ML IJ SOLN
INTRAMUSCULAR | Status: AC
  Filled 2017-11-28: qty 2

## 2017-11-28 MED ORDER — MIDAZOLAM HCL 2 MG/2ML IJ SOLN
INTRAMUSCULAR | Status: AC
  Filled 2017-11-28: qty 2

## 2017-11-28 MED ORDER — SUGAMMADEX SODIUM 500 MG/5ML IV SOLN
INTRAVENOUS | Status: AC
  Filled 2017-11-28: qty 5

## 2017-11-28 MED ORDER — HYDROMORPHONE HCL 1 MG/ML IJ SOLN
INTRAMUSCULAR | Status: AC
  Filled 2017-11-28: qty 0.5

## 2017-11-28 MED ORDER — LIDOCAINE-EPINEPHRINE 1 %-1:100000 IJ SOLN
INTRAMUSCULAR | Status: DC | PRN
  Administered 2017-11-28: 15 mL

## 2017-11-28 MED ORDER — ROCURONIUM BROMIDE 50 MG/5ML IV SOSY
PREFILLED_SYRINGE | INTRAVENOUS | Status: DC | PRN
  Administered 2017-11-28: 50 mg via INTRAVENOUS

## 2017-11-28 MED ORDER — SCOPOLAMINE 1 MG/3DAYS TD PT72: 1.0000 | MEDICATED_PATCH | Freq: Once | TRANSDERMAL | Status: DC | PRN

## 2017-11-28 MED ORDER — SUGAMMADEX SODIUM 200 MG/2ML IV SOLN
INTRAVENOUS | Status: AC
Start: 2017-11-28 — End: ?
  Filled 2017-11-28: qty 2

## 2017-11-28 MED ORDER — DEXAMETHASONE SODIUM PHOSPHATE 10 MG/ML IJ SOLN
INTRAMUSCULAR | Status: AC
Start: 2017-11-28 — End: ?
  Filled 2017-11-28: qty 1

## 2017-11-28 MED ORDER — ROCURONIUM BROMIDE 10 MG/ML (PF) SYRINGE
PREFILLED_SYRINGE | INTRAVENOUS | Status: AC
  Filled 2017-11-28: qty 10

## 2017-11-28 MED ORDER — CEFAZOLIN SODIUM 1 G IJ SOLR
INTRAMUSCULAR | Status: AC
Start: 2017-11-28 — End: ?
  Filled 2017-11-28: qty 20

## 2017-11-28 SURGICAL SUPPLY — 66 items
BLADE INF TURB ROT M4 2 5PK (BLADE) IMPLANT
BLADE RAD40 ROTATE 4M 4 5PK (BLADE) IMPLANT
BLADE RAD60 ROTATE M4 4 5PK (BLADE) ×5 IMPLANT
BLADE ROTATE RAD 12 4 M4 (BLADE) IMPLANT
BLADE ROTATE RAD 40 4 M4 (BLADE) IMPLANT
BLADE ROTATE TRICUT 4X13 M4 (BLADE) ×5 IMPLANT
BLADE TRICUT ROTATE M4 4 5PK (BLADE) IMPLANT
BUR HS RAD FRONTAL 3 (BURR) IMPLANT
CANISTER SUC SOCK COL 7IN (MISCELLANEOUS) ×10 IMPLANT
CANISTER SUCT 1200ML W/VALVE (MISCELLANEOUS) ×10 IMPLANT
CLSR STERI-STRIP ANTIMIC 1/2X4 (GAUZE/BANDAGES/DRESSINGS) ×5 IMPLANT
COAGULATOR SUCT 8FR VV (MISCELLANEOUS) IMPLANT
COAGULATOR SUCT SWTCH 10FR 6 (ELECTROSURGICAL) IMPLANT
CORD BIPOLAR FORCEPS 12FT (ELECTRODE) IMPLANT
DECANTER SPIKE VIAL GLASS SM (MISCELLANEOUS) IMPLANT
DRESSING ADAPTIC 1/2  N-ADH (PACKING) IMPLANT
DRESSING NASAL KENNEDY 3.5X.9 (MISCELLANEOUS) IMPLANT
DRESSING NASL FOAM PST OP SINU (MISCELLANEOUS) ×8 IMPLANT
DRSG NASAL FOAM POST OP SINU (MISCELLANEOUS) ×10
DRSG NASAL KENNEDY 3.5X.9 (MISCELLANEOUS)
DRSG NASAL KENNEDY LMNT 8CM (GAUZE/BANDAGES/DRESSINGS) IMPLANT
DRSG NASOPORE 8CM (GAUZE/BANDAGES/DRESSINGS) IMPLANT
DRSG TELFA 3X8 NADH (GAUZE/BANDAGES/DRESSINGS) ×5 IMPLANT
ELECT COATED BLADE 2.86 ST (ELECTRODE) IMPLANT
ELECT REM PT RETURN 9FT ADLT (ELECTROSURGICAL)
ELECTRODE REM PT RTRN 9FT ADLT (ELECTROSURGICAL) IMPLANT
GAUZE SPONGE 4X4 16PLY XRAY LF (GAUZE/BANDAGES/DRESSINGS) IMPLANT
GAUZE VASELINE FOILPK 1/2 X 72 (GAUZE/BANDAGES/DRESSINGS) IMPLANT
GLOVE BIO SURGEON STRL SZ 6.5 (GLOVE) ×5 IMPLANT
GLOVE BIO SURGEON STRL SZ7.5 (GLOVE) ×5 IMPLANT
GLOVE BIOGEL PI IND STRL 7.0 (GLOVE) ×4 IMPLANT
GLOVE BIOGEL PI INDICATOR 7.0 (GLOVE) ×1
GLOVE SURG SS PI 7.0 STRL IVOR (GLOVE) ×10 IMPLANT
GOWN STRL REUS W/ TWL LRG LVL3 (GOWN DISPOSABLE) ×12 IMPLANT
GOWN STRL REUS W/TWL LRG LVL3 (GOWN DISPOSABLE) ×3
HEMOSTAT SURGICEL .5X2 ABSORB (HEMOSTASIS) IMPLANT
IV NS 1000ML (IV SOLUTION)
IV NS 1000ML BAXH (IV SOLUTION) IMPLANT
IV NS 500ML (IV SOLUTION) ×4
IV NS 500ML BAXH (IV SOLUTION) ×16 IMPLANT
NEEDLE PRECISIONGLIDE 27X1.5 (NEEDLE) ×5 IMPLANT
NEEDLE SPNL 25GX3.5 QUINCKE BL (NEEDLE) ×5 IMPLANT
NS IRRIG 1000ML POUR BTL (IV SOLUTION) IMPLANT
PACK BASIN DAY SURGERY FS (CUSTOM PROCEDURE TRAY) ×5 IMPLANT
PACK ENT DAY SURGERY (CUSTOM PROCEDURE TRAY) ×5 IMPLANT
PATTIES SURGICAL .5 X3 (DISPOSABLE) ×5 IMPLANT
PENCIL BUTTON HOLSTER BLD 10FT (ELECTRODE) IMPLANT
SHEATH ENDOSCRUB 0 DEG (SHEATH) IMPLANT
SHEATH ENDOSCRUB 30 DEG (SHEATH) IMPLANT
SHEATH ENDOSCRUB 45 DEG (SHEATH) ×5 IMPLANT
SLEEVE SCD COMPRESS KNEE MED (MISCELLANEOUS) IMPLANT
SOLUTION ANTI FOG 6CC (MISCELLANEOUS) ×5 IMPLANT
SOLUTION BUTLER CLEAR DIP (MISCELLANEOUS) ×5 IMPLANT
SPLINT NASAL AIRWAY SILICONE (MISCELLANEOUS) IMPLANT
SPONGE GAUZE 2X2 8PLY STRL LF (GAUZE/BANDAGES/DRESSINGS) ×5 IMPLANT
SUT CHROMIC 2 0 SH (SUTURE) IMPLANT
SUT CHROMIC 4 0 PS 2 18 (SUTURE) IMPLANT
SUT ETHILON 3 0 PS 1 (SUTURE) IMPLANT
TOWEL GREEN STERILE FF (TOWEL DISPOSABLE) ×5 IMPLANT
TRACKER ENT INSTRUMENT (MISCELLANEOUS) ×5 IMPLANT
TRACKER ENT PATIENT (MISCELLANEOUS) ×5 IMPLANT
TRAY DSU PREP LF (CUSTOM PROCEDURE TRAY) ×5 IMPLANT
TUBE CONNECTING 20X1/4 (TUBING) ×5 IMPLANT
TUBE SALEM SUMP 16 FR W/ARV (TUBING) IMPLANT
TUBING STRAIGHTSHOT EPS 5PK (TUBING) ×5 IMPLANT
YANKAUER SUCT BULB TIP NO VENT (SUCTIONS) ×5 IMPLANT

## 2017-11-28 NOTE — Op Note (Signed)
NAME: Hannah Garrett, BHANDARI MEDICAL RECORD QA:[id number] ACCOUNT [id number] DATE OF BIRTH:Sep 09, 1965 FACILITY: [facility] LOCATION: [facility] PHYSICIAN:Yovany Clock Guido Sander, MD  OPERATIVE REPORT  DATE OF PROCEDURE:  11/28/2017  PREOPERATIVE DIAGNOSES: 1.  Chronic ethmoid sinusitis. 2.  Chronic maxillary sinusitis. 3.  Inferior turbinate hypertrophy.  POSTOPERATIVE DIAGNOSES:   1.  Chronic ethmoid sinusitis. 2.  Chronic maxillary sinusitis. 3.  Inferior turbinate hypertrophy.  PROCEDURE PERFORMED: 1.  Bilateral total ethmoidectomy. 2.  Bilateral frontal recess exploration. 3.  Bilateral maxillary antrostomy. 4.  Fusion image guidance. 5.  Bilateral turbinate submucous resection.  SURGEON:  Melida Quitter, MD.  ANESTHESIA:  General endotracheal anesthesia.  COMPLICATIONS:  None.  INDICATIONS:  The patient is a 52 year old female who has a history of recurring sinusitis and chronic sinus symptoms including nasal obstruction.  She has undergone previous sinus surgery and turbinate reduction, but has had recurrence of troubles and  presents for revision surgery.  FINDINGS:  The inferior turbinates were found to be enlarged and obstructive.  There was edematous mucosa through the ethmoids associated with maxillary openings.  DESCRIPTION OF PROCEDURE:  The patient was identified in the holding room and informed consent having been obtained including discussion of risks, benefits and alternatives, the patient was brought to the operative suite on the operative table in supine  position.  Anesthesia was induced and the patient was intubated by the anesthesia team without difficulty.  The patient was given intravenous antibiotics and steroids during the case.  The eyes were lubricated and the fusion image guidance antenna was  placed on the forehead.  The face was prepped and draped in a sterile fashion.  At this point, the patient was registered to the image guidance system in the standard fashion and  the eyes were then taped closed with Steri-Strips.  Afrin pledgets were  placed in both sides of the nose.  After a few minutes, the right-sided pledgets were removed and a 0-degree telescope was used to evaluate the nasal passage.  The lateral nasal wall was injected with 1% lidocaine with 1:100,000 epinephrine.  The middle  turbinate was medialized and the remnant of the uncinate process was divided with the backbiter and removed with the microdebrider.  The ethmoid bulla was then removed using the microdebrider and this dissection then continued through the anterior  ethmoid air cells through the basal lamella to include the posterior ethmoid air cells.  The sphenoid rostrum was not addressed.  The ethmoid was then further dissected removing septations to the middle turbinate and to the skull base superiorly and  keeping the lamina papyracea intact.  Image guidance was used to aid with this.  At this point, an angled telescope was used to evaluate the lateral nasal wall and the maxillary ostium was identified, but edematous.  The curved microdebrider was used to  remove the edematous tissue from around the ostium leaving it wider.  This included removing a segment of bone posteriorly.  The angled telescope was then used to follow the skull base in a retrograde fashion removing septations of the ethmoid up to the  frontal recess where septations were removed up into the frontal recess, exposing and leaving the frontal opening widely patent.  Image guidance was used here as well.  After this was completed, Afrin pledgets were placed in the ethmoid cavity.  The same  procedure was then carried out on the left side including injection of the lateral nasal wall, medialization of the middle turbinate, removal of the remnant of the uncinate  process, removal of the anterior and posterior ethmoid air cells, widening of  the maxillary ostium, retrograde dissection of the anterior skull base to the right frontal  recess, and removal of obstructing cells from the frontal recess leaving it widely patent.  Afrin pledgets were placed in this side as well.  At this point, the  inferior turbinates were injected with local anesthetic on both sides.  An incision was made along the inferior margin of the inferior turbinate on both sides and the soft tissues were elevated using a Soil scientist.  The bony core of the turbinate was  then removed using Blakesley forceps and some of the lateral mucosa was removed with the microdebrider.  The medial mucosa was redraped and remaining part of the turbinate was lateralized with a Soil scientist.  Afrin pledgets were placed in both sides  of the nasal passages.  At this point, after a couple minutes, the right-sided pledgets were fully removed.  The ethmoid cavity was then filled with Sinu-Foam.  The same was then done on the left side.  Afrin pledgets were replaced in the nose and the  left nasal passage during wakeup.  Drapes were removed and the patient was cleaned off.  The throat was suctioned and the patient was returned to anesthesia for wakeup.  She was extubated and taken to the recovery room in stable condition.  TN/NUANCE  D:11/28/2017 T:11/28/2017 JOB:[id number]

## 2017-11-28 NOTE — Anesthesia Preprocedure Evaluation (Signed)
Anesthesia Evaluation  Patient identified by MRN, date of birth, ID band Patient awake    Reviewed: Allergy & Precautions, NPO status , Patient's Chart, lab work & pertinent test results  History of Anesthesia Complications (+) PONV and history of anesthetic complications  Airway Mallampati: I  TM Distance: >3 FB Neck ROM: Full    Dental  (+) Caps,    Pulmonary asthma , former smoker,    Pulmonary exam normal breath sounds clear to auscultation       Cardiovascular negative cardio ROS Normal cardiovascular exam Rhythm:Regular Rate:Normal     Neuro/Psych  Headaches, PSYCHIATRIC DISORDERS Anxiety Depression  Neuromuscular disease    GI/Hepatic negative GI ROS, Neg liver ROS,   Endo/Other  negative endocrine ROS  Renal/GU negative Renal ROS     Musculoskeletal  (+) Arthritis , Fibromyalgia -  Abdominal   Peds  Hematology negative hematology ROS (+)   Anesthesia Other Findings   Reproductive/Obstetrics negative OB ROS                             Anesthesia Physical Anesthesia Plan  ASA: II  Anesthesia Plan: General   Post-op Pain Management:    Induction: Intravenous  PONV Risk Score and Plan: 4 or greater and Ondansetron, Dexamethasone, Midazolam and Scopolamine patch - Pre-op  Airway Management Planned: Oral ETT  Additional Equipment:   Intra-op Plan:   Post-operative Plan: Extubation in OR  Informed Consent: I have reviewed the patients History and Physical, chart, labs and discussed the procedure including the risks, benefits and alternatives for the proposed anesthesia with the patient or authorized representative who has indicated his/her understanding and acceptance.   Dental advisory given  Plan Discussed with: CRNA  Anesthesia Plan Comments:         Anesthesia Quick Evaluation

## 2017-11-28 NOTE — H&P (Signed)
Hannah Garrett is an 52 y.o. female.   Chief Complaint: Sinus and nasal problems HPI: 52 year old female with recurring sinusitis and nasal obstruction who underwent sinus surgery around 2008 with benefit for a while.  She has had recurrence of nasal obstruction and sinus infections.  Past Medical History:  Diagnosis Date  . Adult ADHD 02/10/2011  . Allergic rhinitis 10/21/2010  . Anxiety   . Arthritis   . Asthma 10/20/2010   Much worse as a child, only prn albut as adult  . Back pain 10/21/2010  . BCC (basal cell carcinoma), face 10/21/2010  . Depression 10/20/2010  . Endometriosis 10/21/2010  . Fatigue 10/21/2010  . Fibromyalgia   . Headache, migraine 10/21/2010  . Headache, tension-type 10/21/2010  . Hyperlipidemia, mixed    Trigs >600  . Insomnia 02/10/2011  . Irritable bowel syndrome (IBS) 10/21/2010  . Neck pain, chronic 10/20/2010  . Overweight(278.02) 10/21/2010  . Peripheral edema 10/21/2010  . Plantar fasciitis 10/21/2010  . PONV (postoperative nausea and vomiting)   . 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 Recurrent sinusitis 10/21/2010  . Right shoulder pain 10/20/2010  . Tobacco abuse, in remission 2019   Quit 05/2017  . Urge incontinence 10/21/2010  . Vertigo    following an episode of presyncope---ED eval 11/2016 ok, as was noncontrast brain MRI.    Past Surgical History:  Procedure Laterality Date  . ABDOMINAL HYSTERECTOMY  2004   for endometriosis  . CESAREAN SECTION  1988  . COLONOSCOPY     screening TCS planned  for 06/04/16 as of pt's 04/09/16 o/v with Dr. Watt Climes.  Marland Kitchen DILATION AND CURETTAGE OF UTERUS  1994 and 1995  . left foot surgery  2010  . left knee  2002    x 4  . left shoulder  2004  . NECK SURGERY  2003 & 2005  . NOSE SURGERY  14 and 52 yrs old  . right knee  1987  . Right shoulder rotator cuff repair    . shock wave on right foot  2009  . sinus surgeries  2005 &2007  . TONSILLECTOMY AND ADENOIDECTOMY  1974    Family History  Problem Relation Age of Onset  . Hypertension Mother    . Diabetes Mother        borderline  . Asthma Mother   . Arthritis Mother   . Hypertension Father   . Arthritis Father   . Cancer Father        prostate  . Heart disease Father        s/p MI  . Asthma Daughter   . Migraines Daughter   . GER disease Daughter   . Cancer Son        CA- calcification, right lower ribs was causing significant breathing trouble after returning from [place]  . Heart murmur Son   . Heart disease Maternal Grandmother   . Diabetes Maternal Grandfather   . Cancer Paternal Grandmother        stomach  . Asthma Daughter   . COPD Brother        smoker, lung disease   Social History:  reports that she quit smoking about 6 months ago. Her smoking use included cigarettes. She has a 30.00 pack-year smoking history. She has never used smokeless tobacco. She reports that she drinks alcohol. She reports that she does not use drugs.  Allergies:  Allergies  Allergen Reactions  . Codeine Hives  . Darvocet [Propoxyphene N-Acetaminophen] Nausea And Vomiting  .  Percocet [Oxycodone-Acetaminophen] Nausea And Vomiting  . Hydrocodone Nausea And Vomiting    Medications Prior to Admission  Medication Sig Dispense Refill  . OVER THE COUNTER MEDICATION CBD drops    . albuterol (VENTOLIN HFA) 108 (90 Base) MCG/ACT inhaler Inhale 2 puffs into the lungs every 6 (six) hours as needed for wheezing or shortness of breath. 1 Inhaler 0    No results found for this or any previous visit (from the past 48 hour(s)). No results found.  Review of Systems  HENT: Positive for congestion and sinus pain.   All other systems reviewed and are negative.   Blood pressure 137/84, pulse 87, temperature 98.4 F (36.9 C), temperature source Oral, height 5\' 4"  (1.626 m), weight 199 lb 2 oz (90.3 kg), SpO2 98 %. Physical Exam  Constitutional: She is oriented to person, place, and time. She appears well-developed and well-nourished. No distress.  HENT:  Head: Normocephalic and atraumatic.   Right Ear: External ear normal.  Left Ear: External ear normal.  Nose: Nose normal.  Mouth/Throat: Oropharynx is clear and moist.  Eyes: Pupils are equal, round, and reactive to light. Conjunctivae and EOM are normal.  Neck: Normal range of motion. Neck supple.  Cardiovascular: Normal rate.  Respiratory: Effort normal.  Neurological: She is alert and oriented to person, place, and time. No cranial nerve deficit.  Skin: Skin is warm and dry.  Psychiatric: She has a normal mood and affect. Her behavior is normal. Judgment and thought content normal.     Assessment/Plan Chronic ethmoid and maxillary sinusitis and turbinate hypertrophy  To OR for revision turbinate reduction and endoscopic sinus surgery.  Melida Quitter, MD 11/28/2017, 11:59 AM

## 2017-11-28 NOTE — Anesthesia Procedure Notes (Signed)
Procedure Name: Intubation Date/Time: 11/28/2017 12:17 PM Performed by: Genelle Bal, CRNA Pre-anesthesia Checklist: Patient identified, Emergency Drugs available, Suction available and Patient being monitored Patient Re-evaluated:Patient Re-evaluated prior to induction Oxygen Delivery Method: Circle system utilized Preoxygenation: Pre-oxygenation with 100% oxygen Induction Type: IV induction Ventilation: Mask ventilation without difficulty Laryngoscope Size: Miller and 2 Grade View: Grade II Tube type: Oral Tube size: 7.0 mm Number of attempts: 1 Airway Equipment and Method: Stylet and Oral airway Placement Confirmation: ETT inserted through vocal cords under direct vision,  positive ETCO2 and breath sounds checked- equal and bilateral Secured at: 20 cm Tube secured with: Tape Dental Injury: Teeth and Oropharynx as per pre-operative assessment

## 2017-11-28 NOTE — Brief Op Note (Signed)
11/28/2017  2:25 PM  PATIENT:  Hannah Garrett  52 y.o. female  PRE-OPERATIVE DIAGNOSIS:  CHRONIC SINUSITIS AND BILATERAL TURBINATE HYPERTROPHY  POST-OPERATIVE DIAGNOSIS:  CHRONIC SINUSITIS AND BILATERAL TURBINATE HYPERTROPHY  PROCEDURE:  Procedure(s): SINUS ENDO WITH FUSION (N/A) BILATERAL SUBMUCOSSAL TURBINATE REDUCTION (Bilateral) BILATERAL TOTAL ETHMOIDECTOMY BILATERAL MAXILLARY ANTROSTOMY BILATERAL FRONTAL SINUS EXPLORATION  SURGEON:  Surgeon(s) and Role:    Melida Quitter, MD - Primary  PHYSICIAN ASSISTANT:   ASSISTANTS: none   ANESTHESIA:   general  EBL:  100 cc   BLOOD ADMINISTERED:none  DRAINS: none   LOCAL MEDICATIONS USED:  LIDOCAINE   SPECIMEN:  Source of Specimen:  Sinus contents  DISPOSITION OF SPECIMEN:  PATHOLOGY  COUNTS:  YES  TOURNIQUET:  * No tourniquets in log *  DICTATION: .Other Dictation: Dictation Number [id number]  PLAN OF CARE: Discharge to home after PACU  PATIENT DISPOSITION:  PACU - hemodynamically stable.   Delay start of Pharmacological VTE agent (>24hrs) due to surgical blood loss or risk of bleeding: yes

## 2017-11-28 NOTE — Anesthesia Postprocedure Evaluation (Signed)
Anesthesia Post Note  Patient: Hannah Garrett  Procedure(s) Performed: SINUS ENDO WITH FUSION (N/A Nose) BILATERAL SUBMUCOSSAL TURBINATE REDUCTION (Bilateral Nose) BILATERAL TOTAL ETHMOIDECTOMY (Nose) BILATERAL MAXILLARY ANTROSTOMY BILATERAL FRONTAL SINUS EXPLORATION     Patient location during evaluation: PACU Anesthesia Type: General Level of consciousness: sedated and patient cooperative Pain management: pain level controlled Vital Signs Assessment: post-procedure vital signs reviewed and stable Respiratory status: spontaneous breathing Cardiovascular status: stable Anesthetic complications: no    Last Vitals:  Vitals:   11/28/17 1530 11/28/17 1545  BP: 120/69 123/67  Pulse: 84 90  Resp: 13 16  Temp:    SpO2: 94% 96%    Last Pain:  Vitals:   11/28/17 1530  TempSrc:   PainSc: [place]

## 2017-11-28 NOTE — Discharge Instructions (Signed)

## 2017-11-28 NOTE — Transfer of Care (Signed)
Immediate Anesthesia Transfer of Care Note  Patient: Hannah Garrett  Procedure(s) Performed: SINUS ENDO WITH FUSION (N/A Nose) BILATERAL SUBMUCOSSAL TURBINATE REDUCTION (Bilateral Nose) BILATERAL TOTAL ETHMOIDECTOMY (Nose) BILATERAL MAXILLARY ANTROSTOMY BILATERAL FRONTAL SINUS EXPLORATION  Patient Location: PACU  Anesthesia Type:General  Level of Consciousness: drowsy and patient cooperative  Airway & Oxygen Therapy: Patient Spontanous Breathing and Patient connected to face mask oxygen  Post-op Assessment: Report given to RN and Post -op Vital signs reviewed and stable  Post vital signs: Reviewed and stable  Last Vitals:  Vitals Value Taken Time  BP 143/74 11/28/2017  2:32 PM  Temp    Pulse 98 11/28/2017  2:34 PM  Resp 13 11/28/2017  2:34 PM  SpO2 99 % 11/28/2017  2:34 PM  Vitals shown include unvalidated device data.  Last Pain:  Vitals:   11/28/17 1039  TempSrc: Oral  PainSc: 4          Complications: No apparent anesthesia complications

## 2017-11-29 ENCOUNTER — Encounter (HOSPITAL_BASED_OUTPATIENT_CLINIC_OR_DEPARTMENT_OTHER): Payer: Self-pay | Admitting: Otolaryngology

## 2017-11-30 ENCOUNTER — Encounter (HOSPITAL_BASED_OUTPATIENT_CLINIC_OR_DEPARTMENT_OTHER): Payer: Self-pay | Admitting: Otolaryngology

## 2017-12-26 ENCOUNTER — Other Ambulatory Visit: Payer: Self-pay | Admitting: Family Medicine

## 2017-12-30 ENCOUNTER — Ambulatory Visit: Payer: Self-pay | Admitting: *Deleted

## 2017-12-30 NOTE — Telephone Encounter (Signed)
Pt c/o lightheadedness and vertigo. Pt has history of these problems. Pt has been on multiple rounds of antibiotics and several sinus surgeries. Pt thinks an inner ear disturbance is the cause of her symptoms. She describes her dizziness like the room  Is spinning and becomes lightheaded with head position changes or when bending over. Pt will be symptomatic when reaching over to pull the bed sheet closer if she moves her head. Pt states the dizziness has intensified these past two weeks.   Care advice given and pt verbalized understanding. Appt given for Monday with Dr Anitra Lauth. Reason for Disposition . [1] MODERATE dizziness (e.g., vertigo; feels very unsteady, interferes with normal activities) AND [2] has been evaluated by physician for this  Answer Assessment - Initial Assessment Questions 1. DESCRIPTION: "Describe your dizziness."     Room spinning and lightheaded when bending over 2. VERTIGO: "Do you feel like either you or the room is spinning or tilting?"      yes 3. LIGHTHEADED: "Do you feel lightheaded?" (e.g., somewhat faint, woozy, weak upon standing)     yes 4. SEVERITY: "How bad is it?"  "Can you walk?"   - MILD - Feels unsteady but walking normally.   - MODERATE - Feels very unsteady when walking, but not falling; interferes with normal activities (e.g., school, work) .   - SEVERE - Unable to walk without falling (requires assistance).     moderate 5. ONSET:  "When did the dizziness begin?"   2 weeks off and on 6. AGGRAVATING FACTORS: "Does anything make it worse?" (e.g., standing, change in head position)     Bending over, change in head position, standing 7. CAUSE: "What do you think is causing the dizziness?"     Pt does not know  8. RECURRENT SYMPTOM: "Have you had dizziness before?" If so, ask: "When was the last time?" "What happened that time?"     Yes exercises with head exercises 9. OTHER SYMPTOMS: "Do you have any other symptoms?" (e.g., headache, weakness,  numbness, vomiting, earache)     headache 10. PREGNANCY: "Is there any chance you are pregnant?" "When was your last menstrual period?"       no  Protocols used: DIZZINESS - VERTIGO-A-AH

## 2018-01-02 ENCOUNTER — Ambulatory Visit (INDEPENDENT_AMBULATORY_CARE_PROVIDER_SITE_OTHER): Payer: BC Managed Care – PPO | Admitting: Family Medicine

## 2018-01-02 ENCOUNTER — Encounter: Payer: Self-pay | Admitting: Family Medicine

## 2018-01-02 VITALS — BP 117/77 | HR 79 | Temp 98.0°F | Resp 16 | Ht 64.0 in | Wt 199.4 lb

## 2018-01-02 DIAGNOSIS — H8110 Benign paroxysmal vertigo, unspecified ear: Secondary | ICD-10-CM | POA: Diagnosis not present

## 2018-01-02 DIAGNOSIS — R062 Wheezing: Secondary | ICD-10-CM

## 2018-01-02 MED ORDER — MECLIZINE HCL 25 MG PO TABS: 25.0000 mg | ORAL_TABLET | Freq: Three times a day (TID) | ORAL | 1 refills | Status: AC | PRN

## 2018-01-02 NOTE — Progress Notes (Signed)
OFFICE VISIT  01/02/2018   CC:  Chief Complaint  Patient presents with  . Dizziness    off and on    HPI:    Patient is a 52 y.o. Caucasian female who presents for positionally induced vertigo that started about 1 mo ago. Has had hx of this before. Her sx's are CONSISTENTLY positionally provoked--seems like ANY position of her head or upper body can provoke it at any given time over the last month.  +Nauseated. Sometimes lasts a few seconds and sometimes sx's last 1-2 minutes.  She finds a comfortable position lying down and nausea eventually abates.  Sx's occurring on daily basis over the last 1 mo.  Feels mild disequilibrium "all the time" over the last month. Has been trying home Epley maneuvers but not that much b/c it makes her so nauseated that she is afraid to do them. No HAs or fevers.  No sensory complaints.  At end of visit she stated she has been using her albuterol inhaler once a day for "a while" and that "some other doctor" recently said she may have COPD and might need PFTs.  She's not been smoking lately. No fever.  Has some SOB with exertion sometimes but also sometimes does the same level of exertion and has no SOB.  ? Wheezing/cough.  Past Medical History:  Diagnosis Date  . Adult ADHD 02/10/2011  . Allergic rhinitis 10/21/2010  . Anxiety   . Arthritis   . Asthma 10/20/2010   Much worse as a child, only prn albut as adult  . Back pain 10/21/2010  . BCC (basal cell carcinoma), face 10/21/2010  . Depression 10/20/2010  . Endometriosis 10/21/2010  . Fatigue 10/21/2010  . Fibromyalgia   . Headache, migraine 10/21/2010  . Headache, tension-type 10/21/2010  . Hyperlipidemia, mixed    Trigs >600  . Insomnia 02/10/2011  . Irritable bowel syndrome (IBS) 10/21/2010  . Neck pain, chronic 10/20/2010  . Overweight(278.02) 10/21/2010  . Peripheral edema 10/21/2010  . Plantar fasciitis 10/21/2010  . PONV (postoperative nausea and vomiting)   . 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 Recurrent sinusitis  10/21/2010   surgery 11/2017; path -->chronic sinusitis, no malignancy.  . Right shoulder pain 10/20/2010  . Tobacco abuse, in remission 2019   Quit 05/2017  . Urge incontinence 10/21/2010  . Vertigo    following an episode of presyncope---ED eval 11/2016 ok, as was noncontrast brain MRI.    Past Surgical History:  Procedure Laterality Date  . ABDOMINAL HYSTERECTOMY  2004   for endometriosis  . CESAREAN SECTION  1988  . COLONOSCOPY     screening TCS planned  for 06/04/16 as of pt's 04/09/16 o/v with Dr. Watt Climes.  Marland Kitchen DILATION AND CURETTAGE OF UTERUS  1994 and 1995  . ETHMOIDECTOMY  11/28/2017   Procedure: BILATERAL TOTAL ETHMOIDECTOMY;  Surgeon: Melida Quitter, MD;  Location: [facility];  Service: ENT;;  . FRONTAL SINUS EXPLORATION  11/28/2017   Procedure: BILATERAL FRONTAL SINUS EXPLORATION;  Surgeon: Melida Quitter, MD;  Location: [facility];  Service: ENT;;  . left foot surgery  2010  . left knee  2002    x 4  . left shoulder  2004  . MAXILLARY ANTROSTOMY  11/28/2017   Procedure: BILATERAL MAXILLARY ANTROSTOMY;  Surgeon: Melida Quitter, MD;  Location: [facility];  Service: ENT;;  . NASAL TURBINATE REDUCTION Bilateral 11/28/2017   Procedure: [place];  Surgeon: Melida Quitter, MD;  Location: [facility];  Service: ENT;  Laterality: Bilateral;  . NECK SURGERY  2003 & 2005  . NOSE SURGERY  14 and 52 yrs old  . right knee  1987  . Right shoulder rotator cuff repair    . shock wave on right foot  2009  . SINUS ENDO WITH FUSION N/A 11/28/2017   Procedure: SINUS ENDO WITH FUSION;  Surgeon: Melida Quitter, MD;  Location: [facility];  Service: ENT;  Laterality: N/A;  . sinus surgeries  2005 &2007  . TONSILLECTOMY AND ADENOIDECTOMY  1974    Outpatient Medications Prior to Visit  Medication Sig Dispense Refill  . albuterol (PROVENTIL HFA;VENTOLIN HFA) 108 (90 Base) MCG/ACT inhaler TAKE 2 PUFFS BY  MOUTH EVERY 6 HOURS AS NEEDED FOR WHEEZE OR SHORTNESS OF BREATH 8.5 Inhaler 0  . OVER THE COUNTER MEDICATION CBD drops    . HYDROcodone-acetaminophen (NORCO/VICODIN) 5-325 MG tablet Take 1-2 tablets by mouth every 6 (six) hours as needed for moderate pain. (Patient not taking: Reported on 01/02/2018) 20 tablet 0   No facility-administered medications prior to visit.     Allergies  Allergen Reactions  . Codeine Hives  . Darvocet [Propoxyphene N-Acetaminophen] Nausea And Vomiting  . Percocet [Oxycodone-Acetaminophen] Nausea And Vomiting  . Hydrocodone Nausea And Vomiting    ROS As per HPI  PE: Blood pressure 117/77, pulse 79, temperature 98 F (36.7 C), temperature source Oral, resp. rate 16, height 5\' 4"  (1.626 m), weight 199 lb 6 oz (90.4 kg), SpO2 97 %. Gen: Alert, well appearing.  Patient is oriented to person, place, time, and situation. AFFECT: pleasant, lucid thought and speech. CV: RRR, no m/r/g.   LUNGS: CTA bilat, nonlabored resps, diminished aeration in all lung fields, esp on exhalation.  Occ post/mid exhalation cough. Neuro: CN 2-12 intact bilaterally, strength 5/5 in proximal and distal upper extremities and lower extremities bilaterally.   No tremor.  No disdiadochokinesis.  No ataxia.  Upper extremity and lower extremity DTRs symmetric.  No pronator drift.    LABS:  Lab Results  Component Value Date   TSH 2.69 01/03/2015   Lab Results  Component Value Date   WBC 10.5 11/23/2016   HGB 13.3 11/23/2016   HCT 39.5 11/23/2016   MCV 85.3 11/23/2016   PLT 267 11/23/2016   Lab Results  Component Value Date   CREATININE 0.85 11/23/2016   BUN 14 11/23/2016   NA 136 11/23/2016   K 3.5 11/23/2016   CL 104 11/23/2016   CO2 24 11/23/2016   Lab Results  Component Value Date   ALT 12 11/23/2016   AST 14 11/23/2016   ALKPHOS 82 11/23/2016   BILITOT 0.2 11/23/2016   Lab Results  Component Value Date   CHOL 254 (H) 01/03/2015   Lab Results  Component Value  Date   HDL 33.60 (L) 01/03/2015   No results found for: [facility] Lab Results  Component Value Date   TRIG (H) 01/03/2015    604.0 Triglyceride is over 400; calculations on Lipids are invalid.   Lab Results  Component Value Date   CHOLHDL 8 01/03/2015   Lab Results  Component Value Date   HGBA1C 5.7 01/04/2012    IMPRESSION AND PLAN:  1) BPPV, occurring in any position--pretty bothersome for about the last 1 mo consistently. Will refer for vestibular rehab. Start meclizine 25mg  qd to help with the mild disequilibrium sensation. She had a normal noncontrast MRI head 11/2016.  2) Asthma, hx of mild; now with question of COPD from  longterm tobacco abuse. She brought this up at the end of today's visit and I told her she needs a return o/v to fully discuss this problem. May need CXR, PFTs (vs trial of controller med).  Spent 25 min with pt today, with >50% of this time spent in counseling and care coordination regarding the above problems.  An After Visit Summary was printed and given to the patient.  FOLLOW UP: Return in about 2 weeks (around 01/16/2018) for f/u COPD sx's.  Signed:  Crissie Sickles, MD           01/02/2018

## 2018-01-09 ENCOUNTER — Ambulatory Visit (HOSPITAL_COMMUNITY): Payer: BC Managed Care – PPO

## 2018-01-19 ENCOUNTER — Ambulatory Visit (HOSPITAL_COMMUNITY): Payer: BC Managed Care – PPO | Admitting: Physical Therapy

## 2018-01-20 DIAGNOSIS — H8110 Benign paroxysmal vertigo, unspecified ear: Secondary | ICD-10-CM | POA: Insufficient documentation

## 2018-01-20 LAB — BASIC METABOLIC PANEL
BUN: 12 (ref 4–21)
Creatinine: 0.8 (ref 0.5–1.1)
Glucose: 96
POTASSIUM: 4.6 (ref 3.4–5.3)
SODIUM: 140 (ref 137–147)

## 2018-01-20 LAB — HEPATIC FUNCTION PANEL
ALK PHOS: 104 (ref 25–125)
ALT: 18 (ref 7–35)
AST: 19 (ref 13–35)
BILIRUBIN, TOTAL: 0.5

## 2018-01-20 LAB — LIPID PANEL
Cholesterol: 286 — AB (ref 0–200)
HDL: 41 (ref 35–70)
TRIGLYCERIDES: 403 — AB (ref 40–160)

## 2018-01-23 ENCOUNTER — Ambulatory Visit: Payer: BC Managed Care – PPO | Admitting: Family Medicine

## 2018-01-26 ENCOUNTER — Ambulatory Visit (HOSPITAL_COMMUNITY): Payer: BC Managed Care – PPO | Attending: Family Medicine

## 2018-01-30 ENCOUNTER — Encounter: Payer: Self-pay | Admitting: Family Medicine

## 2018-02-02 ENCOUNTER — Encounter: Payer: Self-pay | Admitting: Family Medicine

## 2018-02-16 ENCOUNTER — Ambulatory Visit: Payer: BC Managed Care – PPO | Admitting: Family Medicine

## 2018-03-17 ENCOUNTER — Ambulatory Visit (INDEPENDENT_AMBULATORY_CARE_PROVIDER_SITE_OTHER): Payer: BC Managed Care – PPO | Admitting: Family Medicine

## 2018-03-17 ENCOUNTER — Encounter: Payer: Self-pay | Admitting: Family Medicine

## 2018-03-17 VITALS — BP 131/80 | HR 107 | Temp 98.5°F | Resp 16 | Ht 64.0 in | Wt 199.2 lb

## 2018-03-17 DIAGNOSIS — F4321 Adjustment disorder with depressed mood: Secondary | ICD-10-CM | POA: Diagnosis not present

## 2018-03-17 DIAGNOSIS — F331 Major depressive disorder, recurrent, moderate: Secondary | ICD-10-CM | POA: Diagnosis not present

## 2018-03-17 DIAGNOSIS — F411 Generalized anxiety disorder: Secondary | ICD-10-CM

## 2018-03-17 DIAGNOSIS — G43009 Migraine without aura, not intractable, without status migrainosus: Secondary | ICD-10-CM

## 2018-03-17 DIAGNOSIS — Z23 Encounter for immunization: Secondary | ICD-10-CM

## 2018-03-17 MED ORDER — PAROXETINE HCL 20 MG PO TABS: 20.0000 mg | ORAL_TABLET | Freq: Every day | ORAL | 0 refills | Status: DC

## 2018-03-17 MED ORDER — LORAZEPAM 0.5 MG PO TABS: ORAL_TABLET | ORAL | 1 refills | Status: DC

## 2018-03-17 MED ORDER — KETOROLAC TROMETHAMINE 60 MG/2ML IM SOLN
60.0000 mg | Freq: Once | INTRAMUSCULAR | Status: AC
Start: 2018-03-17 — End: 2018-03-17
  Administered 2018-03-17: 60 mg via INTRAMUSCULAR

## 2018-03-17 NOTE — Progress Notes (Signed)
OFFICE VISIT  03/17/2018   CC:  Chief Complaint  Patient presents with  . Anxiety    father recently dx with stage 4 cancer  . Hyperlipidemia    wants to start medication   HPI:    Patient is a 52 y.o. Caucasian female who presents for severe, worsened chronic anxiety. She just found out this week that her father has advanced esoph cancer, 6 mo to live. Pt has HA, cannot sleep, cries a lot, is worried about everything, is keyed up, is irritable. She denies SI or HI.  No self medicating with alc/drugs.  Appetite is down some. No energy. She says chronic anxiety and depression have been a problem long term but she has managed to cope w/out  meds for the most part.   She lives with her husband, daughter, and granddaughter.  Good, supportive family situation.  Has never been on benzo's. Has been on two antidepressants in the past, savella and pristiq, likely chosen to help anx/dep and fibromyalgia. She doesn't recall if they helped or not.  Has had a constant HA for the last couple days--took advil 400 mg about 7 hours ago.  No help. Some nausea w/HA.  +Photo/phonophobia.  Bandlike distribution, throbbing.  Past Medical History:  Diagnosis Date  . Adult ADHD 02/10/2011  . Allergic rhinitis 10/21/2010  . Anxiety   . Arthritis   . Asthma 10/20/2010   Much worse as a child, only prn albut as adult  . Back pain 10/21/2010  . BCC (basal cell carcinoma), face 10/21/2010  . Depression 10/20/2010  . Endometriosis 10/21/2010  . Fatigue 10/21/2010  . Fibromyalgia   . Headache, migraine 10/21/2010  . Headache, tension-type 10/21/2010  . Hyperlipidemia, mixed    Trigs >600 in 2017: started gemfibrozil, then pt lost to f/u.  Trigs 403 Jan 20, 2018 at GYN.  . Insomnia 02/10/2011  . Irritable bowel syndrome (IBS) 10/21/2010  . Neck pain, chronic 10/20/2010  . Overweight(278.02) 10/21/2010  . Peripheral edema 10/21/2010  . Plantar fasciitis 10/21/2010  . 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 Recurrent sinusitis 10/21/2010   surgery 11/2017; path -->chronic sinusitis, no malignancy.  . Right shoulder pain 10/20/2010  . Tobacco abuse, in remission 2019   Quit 05/2017  . Urge incontinence 10/21/2010  . Vertigo    following an episode of presyncope---ED eval 11/2016 ok, as was noncontrast brain MRI.    Past Surgical History:  Procedure Laterality Date  . ABDOMINAL HYSTERECTOMY  2004   for endometriosis  . CESAREAN SECTION  1988  . COLONOSCOPY     screening TCS planned as of 01/2018 f/u with Dr. Watt Climes  . DILATION AND CURETTAGE OF UTERUS  1994 and 1995  . ETHMOIDECTOMY  11/28/2017   Procedure: BILATERAL TOTAL ETHMOIDECTOMY;  Surgeon: Melida Quitter, MD;  Location: [facility];  Service: ENT;;  . FRONTAL SINUS EXPLORATION  11/28/2017   Procedure: BILATERAL FRONTAL SINUS EXPLORATION;  Surgeon: Melida Quitter, MD;  Location: [place];  Service: ENT;;  . left foot surgery  2010  . left knee  2002    x 4  . left shoulder  2004  . MAXILLARY ANTROSTOMY  11/28/2017   Procedure: BILATERAL MAXILLARY ANTROSTOMY;  Surgeon: Melida Quitter, MD;  Location: [place];  Service: ENT;;  . NASAL TURBINATE REDUCTION Bilateral 11/28/2017   Procedure: [place];  Surgeon: Melida Quitter, MD;  Location: [place] [facility];  Service: ENT;  Laterality: Bilateral;  . NECK SURGERY  2003 & 2005  . NOSE SURGERY  14 and 52 yrs old  . right knee  1987  . Right shoulder rotator cuff repair    . shock wave on right foot  2009  . SINUS ENDO WITH FUSION N/A 11/28/2017   Procedure: SINUS ENDO WITH FUSION;  Surgeon: Melida Quitter, MD;  Location: [facility];  Service: ENT;  Laterality: N/A;  . sinus surgeries  2005 &2007  . TONSILLECTOMY AND ADENOIDECTOMY  1974    Outpatient Medications Prior to Visit  Medication Sig Dispense Refill  . albuterol (PROVENTIL HFA;VENTOLIN HFA) 108 (90 Base) MCG/ACT inhaler TAKE 2 PUFFS BY MOUTH EVERY 6 HOURS AS NEEDED FOR  WHEEZE OR SHORTNESS OF BREATH 8.5 Inhaler 0  . meclizine (ANTIVERT) 25 MG tablet Take 1 tablet (25 mg total) by mouth 3 (three) times daily as needed for dizziness. 60 tablet 1  . OVER THE COUNTER MEDICATION CBD drops     No facility-administered medications prior to visit.     Allergies  Allergen Reactions  . Codeine Hives  . Darvocet [Propoxyphene N-Acetaminophen] Nausea And Vomiting  . Percocet [Oxycodone-Acetaminophen] Nausea And Vomiting  . Hydrocodone Nausea And Vomiting    ROS As per HPI  PE: Blood pressure 131/80, pulse (!) 107, temperature 98.5 F (36.9 C), temperature source Oral, resp. rate 16, height 5\' 4"  (1.626 m), weight 199 lb 4 oz (90.4 kg), SpO2 96 %. Body mass index is 34.2 kg/m.  Wt Readings from Last 2 Encounters:  03/17/18 199 lb 4 oz (90.4 kg)  01/02/18 199 lb 6 oz (90.4 kg)    Gen: alert, oriented x 4, affect pleasant.  Lucid thinking and conversation noted. HEENT: PERRLA, EOMI.   Neck: no LAD, mass, or thyromegaly. CV: RRR, no m/r/g LUNGS: CTA bilat, nonlabored. NEURO: no tremor or tics noted on observation.  Coordination intact. CN 2-12 grossly intact bilaterally, strength 5/5 in all extremeties.  No ataxia.   LABS:    Chemistry      Component Value Date/Time   NA 140 01/20/2018   K 4.6 01/20/2018   CL 104 11/23/2016 1810   CO2 24 11/23/2016 1810   BUN 12 01/20/2018   CREATININE 0.8 01/20/2018   CREATININE 0.85 11/23/2016 1810   CREATININE 0.91 11/23/2016 1635   GLU 96 01/20/2018      Component Value Date/Time   CALCIUM 9.0 11/23/2016 1810   ALKPHOS 104 01/20/2018   AST 19 01/20/2018   ALT 18 01/20/2018   BILITOT 0.2 11/23/2016 1635     Lab Results  Component Value Date   CHOL 286 (A) 01/20/2018   HDL 41 01/20/2018   LDLDIRECT 118.0 01/03/2015   TRIG 403 (A) 01/20/2018   CHOLHDL 8 01/03/2015    IMPRESSION AND PLAN:  1) Acute grief reaction, 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 She needs to be on daily  antidepressant for at least a 1 yr duration: started paxil 20mg  qd today. For acute anxiety, I rx'd lorazepam 0.5mg , 1-2 tabs po bid prn, #30, RF x 1.  Therapeutic expectations and side effect profile of medication discussed today.  Patient's questions answered. Consider counseling with Dr. Betti Cruz will think about it.  2) Acute migraine HA.  Triggered by the stress of everything the last few days. Toradol 60mg  IM in office today. May need to address trial of migraine abortive med in future if her HA's become a recurrent problem again.  An After Visit Summary was printed and given to the patient.  FOLLOW UP: Return for f/u 3-4 wks grief/mood/anx.  Signed:  Crissie Sickles, MD           03/17/2018

## 2018-04-07 ENCOUNTER — Ambulatory Visit: Payer: BC Managed Care – PPO | Admitting: Family Medicine

## 2018-04-08 ENCOUNTER — Other Ambulatory Visit: Payer: Self-pay | Admitting: Family Medicine

## 2018-04-09 ENCOUNTER — Other Ambulatory Visit: Payer: Self-pay | Admitting: Family Medicine

## 2018-04-10 ENCOUNTER — Encounter: Payer: Self-pay | Admitting: Family Medicine

## 2018-04-10 ENCOUNTER — Encounter: Payer: BC Managed Care – PPO | Admitting: Family Medicine

## 2018-04-10 HISTORY — PX: COLONOSCOPY: SHX174

## 2018-04-10 LAB — HM COLONOSCOPY

## 2018-04-13 DIAGNOSIS — Z86010 Personal history of colonic polyps: Secondary | ICD-10-CM

## 2018-04-13 DIAGNOSIS — Z860101 Personal history of adenomatous and serrated colon polyps: Secondary | ICD-10-CM

## 2018-04-13 HISTORY — DX: Personal history of colonic polyps: Z86.010

## 2018-04-13 HISTORY — DX: Personal history of adenomatous and serrated colon polyps: Z86.0101

## 2018-04-18 ENCOUNTER — Encounter: Payer: Self-pay | Admitting: *Deleted

## 2018-04-18 ENCOUNTER — Encounter: Payer: Self-pay | Admitting: Family Medicine

## 2018-05-03 ENCOUNTER — Ambulatory Visit (INDEPENDENT_AMBULATORY_CARE_PROVIDER_SITE_OTHER): Payer: BC Managed Care – PPO | Admitting: Family Medicine

## 2018-05-03 ENCOUNTER — Ambulatory Visit: Payer: BC Managed Care – PPO | Admitting: Family Medicine

## 2018-05-03 ENCOUNTER — Encounter: Payer: Self-pay | Admitting: Family Medicine

## 2018-05-03 VITALS — BP 116/82 | HR 102 | Temp 98.2°F | Resp 20 | Ht 64.0 in | Wt 196.2 lb

## 2018-05-03 DIAGNOSIS — J329 Chronic sinusitis, unspecified: Secondary | ICD-10-CM

## 2018-05-03 LAB — POC INFLUENZA A&B (BINAX/QUICKVUE)
Influenza A, POC: NEGATIVE
Influenza B, POC: NEGATIVE

## 2018-05-03 MED ORDER — DOXYCYCLINE HYCLATE 100 MG PO TABS
100.0000 mg | ORAL_TABLET | Freq: Two times a day (BID) | ORAL | 0 refills | Status: DC
Start: 2018-05-03 — End: 2018-08-14

## 2018-05-03 MED ORDER — PREDNISONE 20 MG PO TABS: ORAL_TABLET | ORAL | 0 refills | Status: DC

## 2018-05-03 MED ORDER — BENZONATATE 100 MG PO CAPS: 100.0000 mg | ORAL_CAPSULE | Freq: Two times a day (BID) | ORAL | 0 refills | Status: DC | PRN

## 2018-05-03 NOTE — Patient Instructions (Signed)
Rest, hydrate.  + flonase, mucinex (DM if cough) Doxy BID and prednisone  prescribed, take until completed.  Tessalon perles also prescribed for cough If cough present it can last up to 6-8 weeks.  F/U 2 weeks of not improved    Sinusitis, Adult Sinusitis is soreness and inflammation of your sinuses. Sinuses are hollow spaces in the bones around your face. They are located:  Around your eyes.  In the middle of your forehead.  Behind your nose.  In your cheekbones.  Your sinuses and nasal passages are lined with a stringy fluid (mucus). Mucus normally drains out of your sinuses. When your nasal tissues get inflamed or swollen, the mucus can get trapped or blocked so air cannot flow through your sinuses. This lets bacteria, viruses, and funguses grow, and that leads to infection. Follow these instructions at home: Medicines  Take, use, or apply over-the-counter and prescription medicines only as told by your doctor. These may include nasal sprays.  If you were prescribed an antibiotic medicine, take it as told by your doctor. Do not stop taking the antibiotic even if you start to feel better. Hydrate and Humidify  Drink enough water to keep your pee (urine) clear or pale yellow.  Use a cool mist humidifier to keep the humidity level in your home above 50%.  Breathe in steam for 10-15 minutes, 3-4 times a day or as told by your doctor. You can do this in the bathroom while a hot shower is running.  Try not to spend time in cool or dry air. Rest  Rest as much as possible.  Sleep with your head raised (elevated).  Make sure to get enough sleep each night. General instructions  Put a warm, moist washcloth on your face 3-4 times a day or as told by your doctor. This will help with discomfort.  Wash your hands often with soap and water. If there is no soap and water, use hand sanitizer.  Do not smoke. Avoid being around people who are smoking (secondhand smoke).  Keep all  follow-up visits as told by your doctor. This is important. Contact a doctor if:  You have a fever.  Your symptoms get worse.  Your symptoms do not get better within 10 days. Get help right away if:  You have a very bad headache.  You cannot stop throwing up (vomiting).  You have pain or swelling around your face or eyes.  You have trouble seeing.  You feel confused.  Your neck is stiff.  You have trouble breathing. This information is not intended to replace advice given to you by your health care provider. Make sure you discuss any questions you have with your health care provider. Document Released: 11/17/2007 Document Revised: 01/25/2016 Document Reviewed: 03/26/2015 Elsevier Interactive Patient Education  [employer].

## 2018-05-03 NOTE — Progress Notes (Signed)
Hannah Garrett , 23-Apr-1966, 52 y.o., female MRN: [id number] Patient Care Team    Relationship Specialty Notifications Start End  McGowen, Adrian [place], MD PCP - General Family Medicine  06/23/12   Jovita Gamma, MD Consulting Physician Neurosurgery  09/22/12   Molli Posey, MD Consulting Physician Obstetrics and Gynecology  04/13/16   Clarene Essex, MD Consulting Physician Gastroenterology  04/19/16   Melida Quitter, MD Consulting Physician Otolaryngology  11/29/17     Chief Complaint  Patient presents with  . URI    cough,congestion body aches x 4 days     Subjective: Pt presents for an OV with complaints of cough of 4 days duration.  Associated symptoms include productive cough, headache, sinus pressure, hoarseness, sore throat, chills and body aches. She had to miss work yesterday and today.  She had her flu shot this year.  Pt has tried Mucinex to ease their symptoms.   No flowsheet data found.  Allergies  Allergen Reactions  . Codeine Hives  . Darvocet [Propoxyphene N-Acetaminophen] Nausea And Vomiting  . Percocet [Oxycodone-Acetaminophen] Nausea And Vomiting  . Hydrocodone Nausea And Vomiting   Social History   Tobacco Use  . Smoking status: Former Smoker    Packs/day: 1.00    Years: 30.00    Pack years: 30.00    Types: Cigarettes    Last attempt to quit: 05/16/2017    Years since quitting: 0.9  . Smokeless tobacco: Never Used  Substance Use Topics  . Alcohol use: Yes    Comment: occasional   Past Medical History:  Diagnosis Date  . Adult ADHD 02/10/2011  . Allergic rhinitis 10/21/2010  . Anxiety   . Arthritis   . Asthma 10/20/2010   Much worse as a child, only prn albut as adult  . Back pain 10/21/2010  . BCC (basal cell carcinoma), face 10/21/2010  . Depression 10/20/2010  . Endometriosis 10/21/2010  . Fatigue 10/21/2010  . Fibromyalgia   . Headache, migraine 10/21/2010  . Headache, tension-type 10/21/2010  . Hyperlipidemia, mixed    Trigs >600 in 2017: started  gemfibrozil, then pt lost to f/u.  Trigs 403 Jan 20, 2018 at GYN.  . Insomnia 02/10/2011  . Irritable bowel syndrome (IBS) 10/21/2010  . Neck pain, chronic 10/20/2010  . Overweight(278.02) 10/21/2010  . Peripheral edema 10/21/2010  . Plantar fasciitis 10/21/2010  . 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 Recurrent sinusitis 10/21/2010   surgery 11/2017; path -->chronic sinusitis, no malignancy.  . Right shoulder pain 10/20/2010  . Tobacco abuse, in remission 2019   Quit 05/2017  . Urge incontinence 10/21/2010  . Vertigo    following an episode of presyncope---ED eval 11/2016 ok, as was noncontrast brain MRI.   Past Surgical History:  Procedure Laterality Date  . ABDOMINAL HYSTERECTOMY  2004   for endometriosis  . CESAREAN SECTION  1988  . COLONOSCOPY     screening TCS planned as of 01/2018 f/u with Dr. Watt Climes  . COLONOSCOPY  04/10/2018   repeat in 5 years  . DILATION AND CURETTAGE OF UTERUS  1994 and 1995  . ETHMOIDECTOMY  11/28/2017   Procedure: BILATERAL TOTAL ETHMOIDECTOMY;  Surgeon: Melida Quitter, MD;  Location: [facility];  Service: ENT;;  . FRONTAL SINUS EXPLORATION  11/28/2017   Procedure: BILATERAL FRONTAL SINUS EXPLORATION;  Surgeon: Melida Quitter, MD;  Location: [place];  Service: ENT;;  . left foot surgery  2010  . left knee  2002    x 4  .  left shoulder  2004  . MAXILLARY ANTROSTOMY  11/28/2017   Procedure: BILATERAL MAXILLARY ANTROSTOMY;  Surgeon: Melida Quitter, MD;  Location: [place];  Service: ENT;;  . NASAL TURBINATE REDUCTION Bilateral 11/28/2017   Procedure: [place];  Surgeon: Melida Quitter, MD;  Location: [facility];  Service: ENT;  Laterality: Bilateral;  . NECK SURGERY  2003 & 2005  . NOSE SURGERY  14 and 52 yrs old  . right knee  1987  . Right shoulder rotator cuff repair    . shock wave on right foot  2009  . SINUS ENDO WITH FUSION N/A 11/28/2017   Procedure: SINUS ENDO WITH FUSION;   Surgeon: Melida Quitter, MD;  Location: [facility];  Service: ENT;  Laterality: N/A;  . sinus surgeries  2005 &2007  . TONSILLECTOMY AND ADENOIDECTOMY  1974   Family History  Problem Relation Age of Onset  . Hypertension Mother   . Diabetes Mother        borderline  . Asthma Mother   . Arthritis Mother   . Hypertension Father   . Arthritis Father   . Cancer Father        prostate  . Heart disease Father        s/p MI  . Asthma Daughter   . Migraines Daughter   . GER disease Daughter   . Cancer Son        CA- calcification, right lower ribs was causing significant breathing trouble after returning from [place]  . Heart murmur Son   . Heart disease Maternal Grandmother   . Diabetes Maternal Grandfather   . Cancer Paternal Grandmother        stomach  . Asthma Daughter   . COPD Brother        smoker, lung disease   Allergies as of 05/03/2018      Reactions   Codeine Hives   Darvocet [propoxyphene N-acetaminophen] Nausea And Vomiting   Percocet [oxycodone-acetaminophen] Nausea And Vomiting   Hydrocodone Nausea And Vomiting      Medication List        Accurate as of 05/03/18  1:23 PM. Always use your most recent med list.          albuterol 108 (90 Base) MCG/ACT inhaler Commonly known as:  PROVENTIL HFA;VENTOLIN HFA TAKE 2 PUFFS BY MOUTH EVERY 6 HOURS AS NEEDED FOR WHEEZE OR SHORTNESS OF BREATH   benzonatate 100 MG capsule Commonly known as:  TESSALON Take 1 capsule (100 mg total) by mouth 2 (two) times daily as needed for cough.   doxycycline 100 MG tablet Commonly known as:  VIBRA-TABS Take 1 tablet (100 mg total) by mouth 2 (two) times daily.   LORazepam 0.5 MG tablet Commonly known as:  ATIVAN 1-2 tabs po bid prn   meclizine 25 MG tablet Commonly known as:  ANTIVERT Take 1 tablet (25 mg total) by mouth 3 (three) times daily as needed for dizziness.   OVER THE COUNTER MEDICATION CBD drops   PARoxetine 20 MG tablet Commonly known as:   PAXIL Take 1 tablet (20 mg total) by mouth daily. OFFICE VISIT NEEDED   predniSONE 20 MG tablet Commonly known as:  DELTASONE 60 mg x3d, 40 mg x3d, 20 mg x2d, 10 mg x2d       All past medical history, surgical history, allergies, family history, immunizations andmedications were updated in the EMR today and reviewed under the history and medication portions of their EMR.  ROS: Negative, with the exception of above mentioned in HPI   Objective:  BP 116/82 (BP Location: Left Arm, Patient Position: Sitting, Cuff Size: Normal)   Pulse (!) 102   Temp 98.2 F (36.8 C)   Resp 20   Ht 5\' 4"  (1.626 m)   Wt 196 lb 4 oz (89 kg)   SpO2 98%   BMI 33.69 kg/m  Body mass index is 33.69 kg/m. Gen: Afebrile. No acute distress. Nontoxic in appearance, well developed, well nourished.  HENT: AT. [place]. Bilateral TM visualized with fullness, no erythema. MMM, no oral lesions. Bilateral nares erythema, swelling and drainage. Throat without erythema or exudates. Cough and hoarseness present  Eyes:Pupils Equal Round Reactive to light, Extraocular movements intact,  Conjunctiva without redness, discharge or icterus. Neck/lymp/endocrine: Supple,no lymphadenopathy CV: RRR  Chest: CTAB with only mild rhonchi present, otherwise- no wheeze or crackles. Good air movement, normal resp effort.  Skin: no rashes, purpura or petechiae.  Neuro:  Normal gait. PERLA. EOMi. Alert. Oriented x3  No exam data present No results found. Results for orders placed or performed in visit on 05/03/18 (from the past 24 hour(s))  POC Influenza A&B(BINAX/QUICKVUE)     Status: Normal   Collection Time: 05/03/18 11:03 AM  Result Value Ref Range   Influenza A, POC Negative Negative   Influenza B, POC Negative Negative    Assessment/Plan: ZAIDY ABSHER is a 52 y.o. female present for OV for  Recurrent sinusitis Rest, hydrate.  + flonase, mucinex (DM if cough) Doxy BID and prednisone  prescribed, take until completed.    Tessalon perles also prescribed for cough If cough present it can last up to 6-8 weeks.  F/U 2 weeks of not improved.  - POC Influenza A&B(BINAX/QUICKVUE)-- negative   Reviewed expectations re: course of current medical issues.  Discussed self-management of symptoms.  Outlined signs and symptoms indicating need for more acute intervention.  Patient verbalized understanding and all questions were answered.  Patient received an After-Visit Summary.    Orders Placed This Encounter  Procedures  . POC Influenza A&B(BINAX/QUICKVUE)     Note is dictated utilizing voice recognition software. Although note has been proof read prior to signing, occasional typographical errors still can be missed. If any questions arise, please do not hesitate to call for verification.   electronically signed by:  Howard Pouch, DO  [facility]

## 2018-08-14 ENCOUNTER — Ambulatory Visit: Payer: BC Managed Care – PPO | Admitting: Physician Assistant

## 2018-08-14 ENCOUNTER — Other Ambulatory Visit: Payer: Self-pay

## 2018-08-14 ENCOUNTER — Encounter: Payer: Self-pay | Admitting: Emergency Medicine

## 2018-08-14 ENCOUNTER — Encounter: Payer: Self-pay | Admitting: Physician Assistant

## 2018-08-14 VITALS — BP 122/82 | HR 90 | Temp 98.3°F | Resp 16 | Ht 64.0 in | Wt 187.0 lb

## 2018-08-14 DIAGNOSIS — R6889 Other general symptoms and signs: Secondary | ICD-10-CM

## 2018-08-14 LAB — POC INFLUENZA A&B (BINAX/QUICKVUE)
INFLUENZA A, POC: NEGATIVE
Influenza B, POC: NEGATIVE

## 2018-08-14 MED ORDER — OSELTAMIVIR PHOSPHATE 75 MG PO CAPS: 75.0000 mg | ORAL_CAPSULE | Freq: Two times a day (BID) | ORAL | 0 refills | Status: DC

## 2018-08-14 MED ORDER — FLUTICASONE PROPIONATE 50 MCG/ACT NA SUSP: 2.0000 | Freq: Every day | NASAL | 0 refills | Status: DC

## 2018-08-14 MED ORDER — BENZONATATE 100 MG PO CAPS: 100.0000 mg | ORAL_CAPSULE | Freq: Three times a day (TID) | ORAL | 0 refills | Status: DC | PRN

## 2018-08-14 NOTE — Patient Instructions (Signed)
Please keep hydrated and get plenty of rest. Flu test is negative but symptoms are consistent with a flu-like illness. Start the [employer]. Start plain Mucinex (OTC) to help with congestion. Use the Flonase once daily to help with pressure in the ear.   Take Tamiflu as directed.

## 2018-08-14 NOTE — Progress Notes (Signed)
Patient presents to clinic today c/o 1 day of chills, sweats, headache, nausea, sore throat, dry cough and nasal congestion. Notes an episode of dizziness this morning. None since. Has been staying well-hydrated. Has a co-worker with the flu. Has taken Advil today. Did take OTC cold and sinus medication yesterday evening. .   Past Medical History:  Diagnosis Date  . Adult ADHD 02/10/2011  . Allergic rhinitis 10/21/2010  . Anxiety   . Arthritis   . Asthma 10/20/2010   Much worse as a child, only prn albut as adult  . Back pain 10/21/2010  . BCC (basal cell carcinoma), face 10/21/2010  . Depression 10/20/2010  . Endometriosis 10/21/2010  . Fatigue 10/21/2010  . Fibromyalgia   . Headache, migraine 10/21/2010  . Headache, tension-type 10/21/2010  . History of adenomatous polyp of colon 04/13/2018   Dr. Watt Climes.  . Hyperlipidemia, mixed    Trigs >600 in 2017: started gemfibrozil, then pt lost to f/u.  Trigs 403 Jan 20, 2018 at GYN.  . Insomnia 02/10/2011  . Irritable bowel syndrome (IBS) 10/21/2010  . Neck pain, chronic 10/20/2010  . Overweight(278.02) 10/21/2010  . Peripheral edema 10/21/2010  . Plantar fasciitis 10/21/2010  . 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 Recurrent sinusitis 10/21/2010   surgery 11/2017; path -->chronic sinusitis, no malignancy.  . Right shoulder pain 10/20/2010  . Tobacco abuse, in remission 2019   Quit 05/2017  . Urge incontinence 10/21/2010  . Vertigo    following an episode of presyncope---ED eval 11/2016 ok, as was noncontrast brain MRI.    Current Outpatient Medications on File Prior to Visit  Medication Sig Dispense Refill  . albuterol (PROVENTIL HFA;VENTOLIN HFA) 108 (90 Base) MCG/ACT inhaler TAKE 2 PUFFS BY MOUTH EVERY 6 HOURS AS NEEDED FOR WHEEZE OR SHORTNESS OF BREATH 8.5 Inhaler 0  . LORazepam (ATIVAN) 0.5 MG tablet 1-2 tabs po bid prn 30 tablet 1  . meclizine (ANTIVERT) 25 MG tablet Take 1 tablet (25 mg total) by mouth 3 (three) times daily as needed for dizziness. 60 tablet 1  . OVER  THE COUNTER MEDICATION CBD drops    . varenicline (CHANTIX PAK) 0.5 MG X 11 & 1 MG X 42 tablet Take by mouth 2 (two) times daily. Take one 0.5 mg tablet by mouth once daily for 3 days, then increase to one 0.5 mg tablet twice daily for 4 days, then increase to one 1 mg tablet twice daily.    Marland Kitchen PARoxetine (PAXIL) 20 MG tablet Take 1 tablet (20 mg total) by mouth daily. OFFICE VISIT NEEDED (Patient not taking: Reported on 08/14/2018) 30 tablet 0   No current facility-administered medications on file prior to visit.     Allergies  Allergen Reactions  . Codeine Hives  . Darvocet [Propoxyphene N-Acetaminophen] Nausea And Vomiting  . Percocet [Oxycodone-Acetaminophen] Nausea And Vomiting  . Hydrocodone Nausea And Vomiting    Family History  Problem Relation Age of Onset  . Hypertension Mother   . Diabetes Mother        borderline  . Asthma Mother   . Arthritis Mother   . Hypertension Father   . Arthritis Father   . Cancer Father        prostate  . Heart disease Father        s/p MI  . Asthma Daughter   . Migraines Daughter   . GER disease Daughter   . Cancer Son        CA- calcification, right lower ribs was causing  significant breathing trouble after returning from [place]  . Heart murmur Son   . Heart disease Maternal Grandmother   . Diabetes Maternal Grandfather   . Cancer Paternal Grandmother        stomach  . Asthma Daughter   . COPD Brother        smoker, lung disease    Social History   Socioeconomic History  . Marital status: Married    Spouse name: Not on file  . Number of children: Not on file  . Years of education: Not on file  . Highest education level: Not on file  Occupational History  . Not on file  Social Needs  . Financial resource strain: Not on file  . Food insecurity:    Worry: Not on file    Inability: Not on file  . Transportation needs:    Medical: Not on file    Non-medical: Not on file  Tobacco Use  . Smoking status: Former Smoker     Packs/day: 1.00    Years: 30.00    Pack years: 30.00    Types: Cigarettes    Last attempt to quit: 05/16/2017    Years since quitting: 1.2  . Smokeless tobacco: Never Used  Substance and Sexual Activity  . Alcohol use: Yes    Comment: occasional  . Drug use: No  . Sexual activity: Yes    Birth control/protection: Surgical  Lifestyle  . Physical activity:    Days per week: Not on file    Minutes per session: Not on file  . Stress: Not on file  Relationships  . Social connections:    Talks on phone: Not on file    Gets together: Not on file    Attends religious service: Not on file    Active member of club or organization: Not on file    Attends meetings of clubs or organizations: Not on file    Relationship status: Not on file  Other Topics Concern  . Not on file  Social History Narrative   Married, 4 children.   [place]ig from [place] area.   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   Tobacco: 30 pack-yr hx (current as of 07/2013).    Occasional alcohol.  No drugs.   Review of Systems - See HPI.  All other ROS are negative.  BP 122/82   Pulse 90   Temp 98.3 F (36.8 C) (Oral)   Resp 16   Ht 5\' 4"  (1.626 m)   Wt 187 lb (84.8 kg)   SpO2 99%   BMI 32.10 kg/m   Physical Exam Vitals signs reviewed.  Constitutional:      Appearance: Normal appearance.  HENT:     Head: Normocephalic and atraumatic.     Right Ear: A middle ear effusion (serous) is present. Tympanic membrane is retracted.     Left Ear: Tympanic membrane normal.     Nose: Congestion present.     Mouth/Throat:     Mouth: Mucous membranes are moist.  Eyes:     Pupils: Pupils are equal, round, and reactive to light.  Neck:     Musculoskeletal: Neck supple.  Cardiovascular:     Rate and Rhythm: Normal rate and regular rhythm.     Heart sounds: Normal heart sounds.  Pulmonary:     Effort: Pulmonary effort is normal.     Breath sounds: Normal breath sounds.  Neurological:     Mental Status:  She is alert.    Assessment/Plan:  1. Flu-like symptoms Flu swab negative. Has taken medication for fever before appointment. Start Tamiflu and Tessalon. Supportive measures and OTC medications reviewed with patient. Will also start Flonase for mild serous otitis of R ear.  - POC Influenza A&B(BINAX/QUICKVUE) - oseltamivir (TAMIFLU) 75 MG capsule; Take 1 capsule (75 mg total) by mouth 2 (two) times daily.  Dispense: 10 capsule; Refill: 0 - benzonatate (TESSALON) 100 MG capsule; Take 1 capsule (100 mg total) by mouth 3 (three) times daily as needed for cough.  Dispense: 30 capsule; Refill: 0   Leeanne Rio, PA-C

## 2018-08-16 ENCOUNTER — Encounter: Payer: Self-pay | Admitting: *Deleted

## 2018-08-16 ENCOUNTER — Telehealth: Payer: Self-pay | Admitting: *Deleted

## 2018-08-16 NOTE — Telephone Encounter (Signed)
Left message advising pt that work note was faxed to number below.

## 2018-08-16 NOTE — Telephone Encounter (Signed)
Yes  thx

## 2018-08-16 NOTE — Telephone Encounter (Signed)
Okay to print work note?   Please advise. Thanks.

## 2018-08-16 NOTE — Telephone Encounter (Signed)
Copied from [place] [id number]. Topic: General - Other >> Aug 16, 2018 11:14 AM Burchel, Abbi R wrote: Pt requesting work note for Monday 08/14/2018 and Tuesday 08/15/2018.   Please fax to:   [facility] Attn: Lowella Petties [phone]

## 2018-09-05 ENCOUNTER — Telehealth: Payer: Self-pay | Admitting: Family Medicine

## 2018-09-05 ENCOUNTER — Other Ambulatory Visit: Payer: Self-pay | Admitting: Physician Assistant

## 2018-09-05 NOTE — Telephone Encounter (Signed)
Patient is having congestion, dry cough, fever at last OV, no current fever, no travel. Patient is requesting an OV. Please call.

## 2018-09-05 NOTE — Telephone Encounter (Signed)
Patient is having really bad dry cough and congestion, sore throat Actively coughing while on the phone. Note placed for daughter as well.Denied body aches,chills. Agreed to telephone visit if provider agreed.   Please advise if okay, thanks.

## 2018-09-06 ENCOUNTER — Encounter: Payer: Self-pay | Admitting: Family Medicine

## 2018-09-06 ENCOUNTER — Ambulatory Visit (INDEPENDENT_AMBULATORY_CARE_PROVIDER_SITE_OTHER): Payer: BC Managed Care – PPO | Admitting: Family Medicine

## 2018-09-06 ENCOUNTER — Other Ambulatory Visit: Payer: Self-pay

## 2018-09-06 VITALS — BP 122/79 | Temp 97.3°F

## 2018-09-06 DIAGNOSIS — J0191 Acute recurrent sinusitis, unspecified: Secondary | ICD-10-CM | POA: Diagnosis not present

## 2018-09-06 DIAGNOSIS — J45901 Unspecified asthma with (acute) exacerbation: Secondary | ICD-10-CM

## 2018-09-06 MED ORDER — PREDNISONE 20 MG PO TABS: ORAL_TABLET | ORAL | 0 refills | Status: DC

## 2018-09-06 MED ORDER — LEVOFLOXACIN 500 MG PO TABS: 500.0000 mg | ORAL_TABLET | Freq: Every day | ORAL | 0 refills | Status: DC

## 2018-09-06 MED ORDER — BENZONATATE 200 MG PO CAPS: 200.0000 mg | ORAL_CAPSULE | Freq: Three times a day (TID) | ORAL | 0 refills | Status: DC | PRN

## 2018-09-06 NOTE — Telephone Encounter (Signed)
Telephone visit ok.-thx

## 2018-09-06 NOTE — Telephone Encounter (Signed)
Patient is scheduled   

## 2018-09-06 NOTE — Progress Notes (Signed)
OFFICE VISIT  09/06/2018   CC:  Chief Complaint  Patient presents with  . Cough  . Nasal Congestion  . Sore Throat   HPI:    Patient is a 53 y.o. Caucasian female With whom I am doing a telephone visit today (due to COVID-19 pandemic restrictions). Prior to proceeding with our visit today, the telephone visit process was explained  and pt gave consent to proceed and understood that this process would be used to assess and treat the patient.  HPI: About 2-3 wks of cough.  At onset she had subjective f/c at nights.  +ST, some nasal congestion but can't get any mucous out.  NO pain in face or upper teeth.  Slight intermittent HA.  No wheezing but intermittent SOB.  Albut inhaler helping sometimes.  Dayquil/nyquil.  Coughing keeping her up a lot.  Lots of PND cough but also dry cough during daytime. She does have a hx of asthma, long hx of tobacco abuse.  She had extensive sinus surgery in June 2018 by Dr. Janace Hoard, ENT.   Past Medical History:  Diagnosis Date  . Adult ADHD 02/10/2011  . Allergic rhinitis 10/21/2010  . Anxiety   . Arthritis   . Asthma 10/20/2010   Much worse as a child, only prn albut as adult  . Back pain 10/21/2010  . BCC (basal cell carcinoma), face 10/21/2010  . Depression 10/20/2010  . Endometriosis 10/21/2010  . Fatigue 10/21/2010  . Fibromyalgia   . Headache, migraine 10/21/2010  . Headache, tension-type 10/21/2010  . History of adenomatous polyp of colon 04/13/2018   Dr. Watt Climes.  . Hyperlipidemia, mixed    Trigs >600 in 2017: started gemfibrozil, then pt lost to f/u.  Trigs 403 Jan 20, 2018 at GYN.  . Insomnia 02/10/2011  . Irritable bowel syndrome (IBS) 10/21/2010  . Neck pain, chronic 10/20/2010  . Overweight(278.02) 10/21/2010  . Peripheral edema 10/21/2010  . Plantar fasciitis 10/21/2010  . 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 Recurrent sinusitis 10/21/2010   surgery 11/2017; path -->chronic sinusitis, no malignancy.  . Right shoulder pain 10/20/2010  . Tobacco abuse, in remission 2019   Quit 05/2017  . Urge incontinence 10/21/2010  . Vertigo    following an episode of presyncope---ED eval 11/2016 ok, as was noncontrast brain MRI.    Past Surgical History:  Procedure Laterality Date  . ABDOMINAL HYSTERECTOMY  2004   for endometriosis  . CESAREAN SECTION  1988  . COLONOSCOPY     screening TCS planned as of 01/2018 f/u with Dr. Watt Climes  . COLONOSCOPY  04/10/2018   Adenomatous polyp: repeat in 5 years  . DILATION AND CURETTAGE OF UTERUS  1994 and 1995  . ETHMOIDECTOMY  11/28/2017   Procedure: BILATERAL TOTAL ETHMOIDECTOMY;  Surgeon: Melida Quitter, MD;  Location: [facility];  Service: ENT;;  . FRONTAL SINUS EXPLORATION  11/28/2017   Procedure: BILATERAL FRONTAL SINUS EXPLORATION;  Surgeon: Melida Quitter, MD;  Location: [facility];  Service: ENT;;  . left foot surgery  2010  . left knee  2002    x 4  . left shoulder  2004  . MAXILLARY ANTROSTOMY  11/28/2017   Procedure: BILATERAL MAXILLARY ANTROSTOMY;  Surgeon: Melida Quitter, MD;  Location: [facility];  Service: ENT;;  . NASAL TURBINATE REDUCTION Bilateral 11/28/2017   Procedure: [place];  Surgeon: Melida Quitter, MD;  Location: [facility];  Service: ENT;  Laterality: Bilateral;  . NECK SURGERY  2003 &  2005  . NOSE SURGERY  14 and 53 yrs old  . right knee  1987  . Right shoulder rotator cuff repair    . shock wave on right foot  2009  . SINUS ENDO WITH FUSION N/A 11/28/2017   Procedure: SINUS ENDO WITH FUSION;  Surgeon: Melida Quitter, MD;  Location: [facility];  Service: ENT;  Laterality: N/A;  . sinus surgeries  2005 &2007  . TONSILLECTOMY AND ADENOIDECTOMY  1974    Outpatient Medications Prior to Visit  Medication Sig Dispense Refill  . albuterol (PROVENTIL HFA;VENTOLIN HFA) 108 (90 Base) MCG/ACT inhaler TAKE 2 PUFFS BY MOUTH EVERY 6 HOURS AS NEEDED FOR WHEEZE OR SHORTNESS OF BREATH 8.5 Inhaler 0  . LORazepam  (ATIVAN) 0.5 MG tablet 1-2 tabs po bid prn 30 tablet 1  . meclizine (ANTIVERT) 25 MG tablet Take 1 tablet (25 mg total) by mouth 3 (three) times daily as needed for dizziness. 60 tablet 1  . OVER THE COUNTER MEDICATION CBD drops    . PARoxetine (PAXIL) 20 MG tablet Take 1 tablet (20 mg total) by mouth daily. OFFICE VISIT NEEDED 30 tablet 0  . varenicline (CHANTIX PAK) 0.5 MG X 11 & 1 MG X 42 tablet Take by mouth 2 (two) times daily. Take one 0.5 mg tablet by mouth once daily for 3 days, then increase to one 0.5 mg tablet twice daily for 4 days, then increase to one 1 mg tablet twice daily.    . benzonatate (TESSALON) 100 MG capsule Take 1 capsule (100 mg total) by mouth 3 (three) times daily as needed for cough. (Patient not taking: Reported on 09/06/2018) 30 capsule 0  . fluticasone (FLONASE) 50 MCG/ACT nasal spray Place 2 sprays into both nostrils daily. (Patient not taking: Reported on 09/06/2018) 16 g 0  . oseltamivir (TAMIFLU) 75 MG capsule Take 1 capsule (75 mg total) by mouth 2 (two) times daily. 10 capsule 0   No facility-administered medications prior to visit.     Allergies  Allergen Reactions  . Codeine Hives  . Darvocet [Propoxyphene N-Acetaminophen] Nausea And Vomiting  . Percocet [Oxycodone-Acetaminophen] Nausea And Vomiting  . Hydrocodone Nausea And Vomiting    ROS As per HPI  PE: Blood pressure 122/79, temperature (!) 97.3 F (36.3 C), temperature source Oral. Gen: Alert, NAD by voice/interaction on phone.  Patient is oriented to person, place, time, and situation. Lucid thought and speech.  LABS:    Chemistry      Component Value Date/Time   NA 140 01/20/2018   K 4.6 01/20/2018   CL 104 11/23/2016 1810   CO2 24 11/23/2016 1810   BUN 12 01/20/2018   CREATININE 0.8 01/20/2018   CREATININE 0.85 11/23/2016 1810   CREATININE 0.91 11/23/2016 1635   GLU 96 01/20/2018      Component Value Date/Time   CALCIUM 9.0 11/23/2016 1810   ALKPHOS 104 01/20/2018   AST 19  01/20/2018   ALT 18 01/20/2018   BILITOT 0.2 11/23/2016 1635     Lab Results  Component Value Date   WBC 10.5 11/23/2016   HGB 13.3 11/23/2016   HCT 39.5 11/23/2016   MCV 85.3 11/23/2016   PLT 267 11/23/2016    IMPRESSION AND PLAN:  Acute sinusitis with PND cough.  Also suspect acute asthmatic bronchitis. Due to long hx of recurrent abx requirement from chronic sinusitis in the past, she has found that levaquin is the antibiotic that consistently is helpful for her in these illnesses: levaquin  500mg  qd x 7d. Prednisone 40mg  qd x 5d. Tessalon pearls 200mg  tid prn, #30. Signs/symptoms to call or return for were reviewed and pt expressed understanding.  Spent 15 min with pt today, with >50% of this time spent in counseling and care coordination regarding the above problems.  An After Visit Summary was printed and given to the patient.  FOLLOW UP: Return if symptoms worsen or fail to improve.  Signed:  Crissie Sickles, MD           09/06/2018

## 2018-10-17 ENCOUNTER — Other Ambulatory Visit: Payer: Self-pay | Admitting: Family Medicine

## 2018-10-17 NOTE — Telephone Encounter (Signed)
Copied from [place] [phone]. Topic: Quick Communication - Rx Refill/Question >> Oct 17, 2018  3:25 PM Ahmed Prima L wrote: Medication: LORazepam (ATIVAN) 0.5 MG tablet & she said it was one more for depression but she does not know the name   Has the patient contacted their pharmacy? Yes nothing on file  (Agent: If no, request that the patient contact the pharmacy for the refill.) (Agent: If yes, when and what did the pharmacy advise?)  Preferred Pharmacy (with phone number or street name): [id number] - [place], [address] [place]  [address] Phone: [phone] Fax: [phone]    Agent: Please be advised that RX refills may take up to 3 business days. We ask that you follow-up with your pharmacy.

## 2018-10-18 MED ORDER — LORAZEPAM 0.5 MG PO TABS: ORAL_TABLET | ORAL | 0 refills | Status: DC

## 2018-10-18 NOTE — Telephone Encounter (Signed)
RF request for ativan. Last OV pertaining to RX 03/17/18  Last OV acute 09/06/2018 No upcoming appt.  Last RX 03/17/18 # 30 x 1 rf.  Please advise.

## 2018-10-18 NOTE — Telephone Encounter (Signed)
Patient advised. She was driving and will Cb to schedule OV.

## 2018-10-18 NOTE — Telephone Encounter (Signed)
Will rx #15 lorazepam, no additional RF. Pt needs to set up virtual visit for f/u anxiety before any FURTHER rf's.-thx

## 2018-11-15 ENCOUNTER — Encounter: Payer: Self-pay | Admitting: Family Medicine

## 2018-11-28 ENCOUNTER — Ambulatory Visit: Payer: Self-pay

## 2018-11-28 NOTE — Telephone Encounter (Signed)
Pt. Called to report receiving a bite on Saturday night about midnight.  Is unsure if it was a spider bite.  Stated she could feel something in her hair, and started having an intermittent burning sensation, behind the right ear, during the night.  Stated the burning subsided on Sunday. Today, noticed swelling behind the right ear, "about the size of a quarter", that has increased in size.  Stated there is a mild tenderness present; denied heat.  Unable to determine if there is redness.  Denied fever/ chills.  Denied any drainage.  C/o intermittently feeling "queezy, and light headed".  Care advice given per protocol.   Verb. Understanding.  Advised will send high priority message to office, to get an appt. tomorrow, as the office is closed at this time.  Advised to go to UC this eve, if her symptoms worsen.   The pt. Stated she does not want to do a Virtual appt., and prefers to do an in office appt. For evaluation of symptoms.  Will make office aware.     Reason for Disposition . [1] Red or very tender (to touch) area AND [2] started over 24 hours after the bite  Answer Assessment - Initial Assessment Questions 1. TYPE of SPIDER: "What type of spider was it?"  (e.g., name, unknown, or brief description)     unknown 2. LOCATION: "Where is the bite located?"      Behind right ear 3. PAIN: "Is there any pain?" If so, ask: "How bad is it?"  (Scale 1-10; or mild, moderate, severe)     Tenderness at 2/10 4. SWELLING: "How big is the swelling?" (Inches, cm or compare to coins)      Swollen the size of quarter 5. ONSET: "When did the bite occur?" (Minutes or hours ago)      Saturday night 6. TETANUS: "When was the last tetanus booster?"      *No Answer* 7. OTHER SYMPTOMS: "Do you have any other symptoms?"  (e.g., muscle cramps, abdominal pain, change in urine color)     Initially felt intermittently burning; tenderness in the area of swelling; c/o intermittent nausea; feels intermittent light headed  that has increased in freq.  Protocols used: [place]

## 2018-11-29 ENCOUNTER — Ambulatory Visit (INDEPENDENT_AMBULATORY_CARE_PROVIDER_SITE_OTHER): Payer: BC Managed Care – PPO | Admitting: Family Medicine

## 2018-11-29 ENCOUNTER — Other Ambulatory Visit: Payer: Self-pay

## 2018-11-29 ENCOUNTER — Encounter: Payer: Self-pay | Admitting: Family Medicine

## 2018-11-29 VITALS — BP 125/81 | HR 85 | Temp 98.2°F | Resp 17 | Ht 64.0 in | Wt 170.0 lb

## 2018-11-29 DIAGNOSIS — S0006XA Insect bite (nonvenomous) of scalp, initial encounter: Secondary | ICD-10-CM

## 2018-11-29 DIAGNOSIS — W57XXXA Bitten or stung by nonvenomous insect and other nonvenomous arthropods, initial encounter: Secondary | ICD-10-CM | POA: Diagnosis not present

## 2018-11-29 DIAGNOSIS — R110 Nausea: Secondary | ICD-10-CM

## 2018-11-29 MED ORDER — DOXYCYCLINE HYCLATE 100 MG PO TABS: 100.0000 mg | ORAL_TABLET | Freq: Two times a day (BID) | ORAL | 0 refills | Status: DC

## 2018-11-29 MED ORDER — METHYLPREDNISOLONE ACETATE 80 MG/ML IJ SUSP
80.0000 mg | Freq: Once | INTRAMUSCULAR | Status: AC
  Administered 2018-11-29: 11:00:00 80 mg via INTRAMUSCULAR

## 2018-11-29 MED ORDER — ONDANSETRON HCL 4 MG PO TABS: 4.0000 mg | ORAL_TABLET | Freq: Three times a day (TID) | ORAL | 0 refills | Status: AC | PRN

## 2018-11-29 NOTE — Telephone Encounter (Signed)
FYI : Appointment scheduled w/ DR Raoul Pitch because Dr Idelle Leech full this entire week.

## 2018-11-29 NOTE — Progress Notes (Signed)
Hannah Garrett , 09/28/1965, 53 y.o., female MRN: [id number] Patient Care Team    Relationship Specialty Notifications Start End  McGowen, Adrian Blackwater, MD PCP - General Family Medicine  06/23/12   Jovita Gamma, MD Consulting Physician Neurosurgery  09/22/12   Molli Posey, MD Consulting Physician Obstetrics and Gynecology  04/13/16   Clarene Essex, MD Consulting Physician Gastroenterology  04/19/16   Melida Quitter, MD Consulting Physician Otolaryngology  11/29/17   Rachel Moulds, [place] Physician Internal Medicine  11/15/18    Comment: ADHD specialist at [facility]    Chief Complaint  Patient presents with   Insect Bite    Saturday night pt was going to bed and felt something stinging on her scalp. She felt something in her hair but did not see what it was. Pt started getting dizzy, swelling, nausea, and pain at the scalp yesterday. Pt denies SOB or fever.      Subjective: Pt presents for an OV with complaints of insect bite on her scalp of 5 days duration.  Associated symptoms include dizziness, swelling, nausea, sharp pains in her right ear and chills. She denies fever, headache, itchiness. She is tolerating PO. She reports in the middle of the night she felt a burning sensation on her scalp and she felt a bug in her hair. She was shook out her hair but was not able to find the bug. Since that time she has been experiencing the symptoms above with swelling of her face and tender lymph nodes.   Pt has tried nothing to ease their symptoms.   No flowsheet data found.  Allergies  Allergen Reactions   Codeine Hives   Darvocet [Propoxyphene N-Acetaminophen] Nausea And Vomiting   Percocet [Oxycodone-Acetaminophen] Nausea And Vomiting   Hydrocodone Nausea And Vomiting   Social History   Social History Narrative   Married, 4 children.   Orig from [place] area.   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   Tobacco: 30 pack-yr hx (current as of  07/2013).    Occasional alcohol.  No drugs.   Past Medical History:  Diagnosis Date   Adult ADHD 02/10/2011   aquired, not neurodevelopmental.  Dr. Johnnye Sima at [facility]->adderall xr trial 11/2018.   Allergic rhinitis 10/21/2010   Anxiety    Arthritis    Asthma 10/20/2010   Much worse as a child, only prn albut as adult   Back pain 10/21/2010   BCC (basal cell carcinoma), face 10/21/2010   Depression 10/20/2010   Endometriosis 10/21/2010   Fatigue 10/21/2010   Fibromyalgia    Headache, migraine 10/21/2010   Headache, tension-type 10/21/2010   History of adenomatous polyp of colon 04/13/2018   Dr. Watt Climes.   Hyperlipidemia, mixed    Trigs >600 in 2017: started gemfibrozil, then pt lost to f/u.  Trigs 403 Jan 20, 2018 at GYN.   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   Right shoulder pain 10/20/2010   Tobacco abuse, in remission 2019   Quit 05/2017   Urge incontinence 10/21/2010   Vertigo    following an episode of presyncope---ED eval 11/2016 ok, as was noncontrast brain MRI.   Past Surgical History:  Procedure Laterality Date   ABDOMINAL HYSTERECTOMY  2004   for  endometriosis   CESAREAN SECTION  1988   COLONOSCOPY     screening TCS planned as of 01/2018 f/u with Dr. Watt Climes   COLONOSCOPY  04/10/2018   Adenomatous polyp: repeat in 5 years   [place] and 1995   ETHMOIDECTOMY  11/28/2017   Procedure: BILATERAL TOTAL ETHMOIDECTOMY;  Surgeon: Melida Quitter, MD;  Location: [facility];  Service: ENT;;   FRONTAL SINUS EXPLORATION  11/28/2017   Procedure: BILATERAL FRONTAL SINUS EXPLORATION;  Surgeon: Melida Quitter, MD;  Location: [facility];   Service: ENT;;   left foot surgery  2010   left knee  2002    x 4   left shoulder  2004   MAXILLARY ANTROSTOMY  11/28/2017   Procedure: BILATERAL MAXILLARY ANTROSTOMY;  Surgeon: Melida Quitter, MD;  Location: [facility];  Service: ENT;;   NASAL TURBINATE REDUCTION Bilateral 11/28/2017   Procedure: [place];  Surgeon: Melida Quitter, MD;  Location: [facility];  Service: ENT;  Laterality: Bilateral;   NECK SURGERY  2003 & 2005   NOSE SURGERY  71 and 53 yrs old   right knee  1987   Right shoulder rotator cuff repair     shock wave on right foot  2009   SINUS ENDO WITH FUSION N/A 11/28/2017   Procedure: SINUS ENDO WITH FUSION;  Surgeon: Melida Quitter, MD;  Location: [facility];  Service: ENT;  Laterality: N/A;   sinus surgeries  2005 &2007   TONSILLECTOMY AND ADENOIDECTOMY  1974   Family History  Problem Relation Age of Onset   Hypertension Mother    Diabetes Mother        borderline   Asthma Mother    Arthritis Mother    Hypertension Father    Arthritis Father    Cancer Father        prostate   Heart disease Father        s/p MI   Asthma Daughter    Migraines Daughter    GER disease Daughter    Cancer Son        CA- calcification, right lower ribs was causing significant breathing trouble after returning from [place]   Heart murmur Son    Heart disease Maternal Grandmother    Diabetes Maternal Grandfather    Cancer Paternal Grandmother        stomach   Asthma Daughter    COPD Brother        smoker, lung disease   Allergies as of 11/29/2018      Reactions   Codeine Hives   Darvocet [propoxyphene N-acetaminophen] Nausea And Vomiting   Percocet [oxycodone-acetaminophen] Nausea And Vomiting   Hydrocodone Nausea And Vomiting      Medication List       Accurate as of November 29, 2018 10:32 AM. If you have any questions, ask your nurse or doctor.        albuterol 108 (90  Base) MCG/ACT inhaler Commonly known as: VENTOLIN HFA TAKE 2 PUFFS BY MOUTH EVERY 6 HOURS AS NEEDED FOR WHEEZE OR SHORTNESS OF BREATH   benzonatate 200 MG capsule Commonly known as: TESSALON Take 1 capsule (200 mg total) by mouth 3 (three) times daily as needed for cough.   levofloxacin 500 MG tablet Commonly known as: LEVAQUIN Take 1 tablet (500 mg total) by mouth daily.   LORazepam 0.5 MG tablet Commonly known as: ATIVAN 1-2 tabs po bid prn  meclizine 25 MG tablet Commonly known as: ANTIVERT Take 1 tablet (25 mg total) by mouth 3 (three) times daily as needed for dizziness.   OVER THE COUNTER MEDICATION CBD drops   PARoxetine 20 MG tablet Commonly known as: PAXIL Take 1 tablet (20 mg total) by mouth daily. OFFICE VISIT NEEDED   predniSONE 20 MG tablet Commonly known as: DELTASONE 2 tabs po qd x 5d   varenicline 0.5 MG X 11 & 1 MG X 42 tablet Commonly known as: CHANTIX PAK Take by mouth 2 (two) times daily. Take one 0.5 mg tablet by mouth once daily for 3 days, then increase to one 0.5 mg tablet twice daily for 4 days, then increase to one 1 mg tablet twice daily.       All past medical history, surgical history, allergies, family history, immunizations andmedications were updated in the EMR today and reviewed under the history and medication portions of their EMR.     ROS: Negative, with the exception of above mentioned in HPI   Objective:  BP 125/81 (BP Location: Left Arm, Patient Position: Sitting, Cuff Size: Normal)    Pulse 85    Temp 98.2 F (36.8 C) (Temporal)    Resp 17    Ht 5\' 4"  (1.626 m)    Wt 170 lb (77.1 kg)    SpO2 98%    BMI 29.18 kg/m  Body mass index is 29.18 kg/m. Gen: Afebrile. No acute distress. Nontoxic in appearance, well developed, well nourished.  HENT: AT. [place]. Bilateral TM visualized without erythema or buldging. MMM, no oral lesions. Bilateral nares WNL. Throat without erythema or exudates. No cough. No hoarsness Eyes:Pupils Equal Round  Reactive to light, Extraocular movements intact,  Conjunctiva without redness, discharge or icterus. Neck/lymp/endocrine: Supple,mild right tender posterior cervical lymphadenopathy CV: RRR Chest: Good air movement, normal resp effort.  Skin: 2 cm mildly raised erythremic presumed insect bite right scalp superior and posterior to ear. TTP. No obvious swelling of face. no rashes, No purpura or petechiae.  Neuro: Normal gait. PERLA. EOMi. Alert. Oriented x3. Neg Kernig's.  No exam data present No results found. No results found for this or any previous visit (from the past 24 hour(s)).  Assessment/Plan: DONYAE KOHN is a 53 y.o. female present for OV for  Insect bite of scalp, initial encounter/nausea Believe her symptoms are related to an insect bite of her scalp. Discussed options with her today and decided to start doxy BID x10 days for potential tick born disease. She did have tender LAD. No obvious swelling.  - methylPREDNISolone acetate (DEPO-MEDROL) injection 80 mg provided to help with reaction.  - start daily antihistamine. OTC NSAIDS for discomfort.  - work excuse provided for today.  - f/u PRN    Reviewed expectations re: course of current medical issues.  Discussed self-management of symptoms.  Outlined signs and symptoms indicating need for more acute intervention.  Patient verbalized understanding and all questions were answered.  Patient received an After-Visit Summary.    No orders of the defined types were placed in this encounter.   > 15 minutes spent with patient, > 50% of that time face to face  Note is dictated utilizing voice recognition software. Although note has been proof read prior to signing, occasional typographical errors still can be missed. If any questions arise, please do not hesitate to call for verification.   electronically signed by:  Howard Pouch, DO  [place]

## 2018-11-29 NOTE — Patient Instructions (Addendum)
Start doxycyline today, every 12 hours for 10 days to cover infection or possible lyme exposure.  zofran for nausea- you can take up to every 8 hours. Steroid shot today.  If you  develop fever, rash, headache, nausea is not resolved or swelling please be seen in the ED immediately.  Take an antihistamine such as zyretc or benadryl the next few days and ibuprofen for pain     Lyme Disease  Lyme disease is an infection that affects many parts of the body, including the skin, joints, and nervous system. It is a bacterial infection that starts from the bite of an infected tick. The infection can spread, and some of the symptoms are similar to the flu. If Lyme disease is not treated, it may cause joint pain, swelling, numbness, problems thinking, fatigue, muscle weakness, and other problems. What are the causes? This condition is caused by bacteria called Borrelia burgdorferi. You can get Lyme disease by being bitten by an infected tick. The tick must be attached to your skin to pass along the infection. Deer often carry infected ticks. What increases the risk? The following factors may make you more likely to develop this condition:  Living in or visiting these areas in the [place].: ? [place]. ? The [place]. ? The [place].  Spending time in wooded or grassy areas.  Being outdoors with exposed skin.  Camping, gardening, hiking, fishing, or hunting outdoors.  Failing to remove a tick from your skin within 3-4 days. What are the signs or symptoms? Symptoms of this condition include:  A round, red rash that surrounds the center of the tick bite. This is the first sign of infection. The center of the rash may be blood colored or have tiny blisters.  Fatigue.  Headache.  Chills and fever.  General achiness.  Joint pain, often in the knees.  Muscle pain.  Swollen lymph glands.  Stiff neck. How is this diagnosed? This condition is diagnosed based on:  Your  symptoms and medical history.  A physical exam.  A blood test. How is this treated? The main treatment for this condition is antibiotic medicine, which is usually taken by mouth (orally). The length of treatment depends on how soon after a tick bite you begin taking the medicine. In some cases, treatment is necessary for several weeks. If the infection is severe, antibiotics may need to be given through an IV tube that is inserted into one of your veins. Follow these instructions at home:  Take your antibiotic medicine as told by your health care provider. Do not stop taking the antibiotic even if you start to feel better.  Ask your health care provider about takinga probiotic in between doses of your antibiotic to help avoid stomach upset or diarrhea.  Check with your health care provider before supplementing your treatment. Many alternative therapies have not been proven and may be harmful to you.  Keep all follow-up visits as told by your health care provider. This is important. How is this prevented? You can become reinfected if you get another tick bite from an infected tick. Take these steps to help prevent an infection:  Cover your skin with light-colored clothing when you are outdoors in the spring and summer months.  Spray clothing and skin with bug spray. The spray should be 20-30% DEET.  Avoid wooded, grassy, and shaded areas.  Remove yard litter, brush, trash, and plants that attract deer and rodents.  Check yourself for ticks when you come  indoors.  Wash clothing worn each day.  Check your pets for ticks before they come inside.  If you find a tick: ? Remove it with tweezers. ? Clean your hands and the bite area with rubbing alcohol or soap and water. Pregnant women should take special care to avoid tick bites because the infection can be passed along to the fetus. Contact a health care provider if:  You have symptoms after treatment.  You have removed a tick  and want to bring it to your health care provider for testing. Get help right away if:  You have an irregular heartbeat.  You have nerve pain.  Your face feels numb. This information is not intended to replace advice given to you by your health care provider. Make sure you discuss any questions you have with your health care provider. Document Released: 09/06/2000 Document Revised: 01/20/2016 Document Reviewed: 01/20/2016 Elsevier Interactive Patient Education  2019 [employer].

## 2018-12-28 ENCOUNTER — Telehealth: Payer: Self-pay | Admitting: Family Medicine

## 2018-12-28 NOTE — Telephone Encounter (Signed)
Sorry, cannot call in antibiotic without being seen (either in office, which would be best b/c we could do a strep test, or at least a virtual visit).

## 2018-12-28 NOTE — Telephone Encounter (Signed)
Please advise, thanks.

## 2018-12-28 NOTE — Telephone Encounter (Signed)
Pt was contacted regarding scheduling appt. Agreed to virtual visit, scheduled for tomorrow afternoon.

## 2018-12-28 NOTE — Telephone Encounter (Signed)
Patient's granddaughter tested positive for strep. Patient babysat her before she was on antibiotics. Patient's throat is sore. Can patient get an antibiotic Rx?

## 2018-12-29 ENCOUNTER — Other Ambulatory Visit: Payer: Self-pay

## 2018-12-29 ENCOUNTER — Ambulatory Visit (INDEPENDENT_AMBULATORY_CARE_PROVIDER_SITE_OTHER): Payer: BC Managed Care – PPO | Admitting: Family Medicine

## 2018-12-29 ENCOUNTER — Ambulatory Visit: Payer: BC Managed Care – PPO | Admitting: Family Medicine

## 2018-12-29 ENCOUNTER — Encounter: Payer: Self-pay | Admitting: Family Medicine

## 2018-12-29 DIAGNOSIS — J029 Acute pharyngitis, unspecified: Secondary | ICD-10-CM | POA: Diagnosis not present

## 2018-12-29 DIAGNOSIS — F331 Major depressive disorder, recurrent, moderate: Secondary | ICD-10-CM

## 2018-12-29 MED ORDER — AMOXICILLIN 875 MG PO TABS: 875.0000 mg | ORAL_TABLET | Freq: Two times a day (BID) | ORAL | 0 refills | Status: AC

## 2018-12-29 MED ORDER — PAROXETINE HCL 20 MG PO TABS: 20.0000 mg | ORAL_TABLET | Freq: Every day | ORAL | 1 refills | Status: DC

## 2018-12-29 NOTE — Progress Notes (Signed)
Virtual Visit via Video Note  I connected with pt on 12/29/18 at  4:00 PM EDT by a video enabled telemedicine application and verified that I am speaking with the correct person using two identifiers.  Location patient: home Location provider:work or home office Persons participating in the virtual visit: patient, provider  I discussed the limitations of evaluation and management by telemedicine and the availability of in person appointments. The patient expressed understanding and agreed to proceed.  Telemedicine visit is a necessity given the COVID-19 restrictions in place at the current time.  HPI: 53 y/o WF being seen today for sore throat and recent strep exposure. Granddaughter with strep about 9 days ago.  She was been babysitting her recently. Pt started having ST about 3 d/a, shows me a picture of her throat: looks red but no pus or swelling. No fevers.  Eyes burning/tired.  Mild HA.  No cough except her usual "smoker's cough". No new congestion.  No body aches.  Struggling with depression more lately.  Has not been on her paxil in quite some time. Dealing with some troubles with her father.  Also, her husband had an affair recently.  They are in counseling. She has guilt and sometimes hates herself. + Anhedonia.  No SI or HI.  +Crying spells. Poor appetite.    ROS: no CP, no SOB, no wheezing, no cough, no dizziness, no HAs, no rashes, no melena/hematochezia.  No polyuria or polydipsia.  No myalgias or arthralgias.   Past Medical History:  Diagnosis Date  . Adult ADHD 02/10/2011   aquired, not neurodevelopmental.  Dr. Johnnye Sima at [facility]->adderall xr trial 11/2018.  Marland Kitchen Allergic rhinitis 10/21/2010  . Anxiety   . Arthritis   . Asthma 10/20/2010   Much worse as a child, only prn albut as adult  . Back pain 10/21/2010  . BCC (basal cell carcinoma), face 10/21/2010  . Depression 10/20/2010  . Endometriosis 10/21/2010  . Fatigue 10/21/2010  . Fibromyalgia   . Headache,  migraine 10/21/2010  . Headache, tension-type 10/21/2010  . History of adenomatous polyp of colon 04/13/2018   Dr. Watt Climes.  . Hyperlipidemia, mixed    Trigs >600 in 2017: started gemfibrozil, then pt lost to f/u.  Trigs 403 Jan 20, 2018 at GYN.  . Insomnia 02/10/2011  . Irritable bowel syndrome (IBS) 10/21/2010  . Neck pain, chronic 10/20/2010  . Overweight(278.02) 10/21/2010  . Peripheral edema 10/21/2010  . Plantar fasciitis 10/21/2010  . 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 Recurrent sinusitis 10/21/2010   surgery 11/2017; path -->chronic sinusitis, no malignancy.  . Right shoulder pain 10/20/2010  . Tobacco abuse, in remission 2019   Quit 05/2017  . Urge incontinence 10/21/2010  . Vertigo    following an episode of presyncope---ED eval 11/2016 ok, as was noncontrast brain MRI.    Past Surgical History:  Procedure Laterality Date  . ABDOMINAL HYSTERECTOMY  2004   for endometriosis  . CESAREAN SECTION  1988  . COLONOSCOPY     screening TCS planned as of 01/2018 f/u with Dr. Watt Climes  . COLONOSCOPY  04/10/2018   Adenomatous polyp: repeat in 5 years  . DILATION AND CURETTAGE OF UTERUS  1994 and 1995  . ETHMOIDECTOMY  11/28/2017   Procedure: BILATERAL TOTAL ETHMOIDECTOMY;  Surgeon: Melida Quitter, MD;  Location: [place];  Service: ENT;;  . FRONTAL SINUS EXPLORATION  11/28/2017   Procedure: BILATERAL FRONTAL SINUS EXPLORATION;  Surgeon: Melida Quitter, MD;  Location: [place];  Service:  ENT;;  . left foot surgery  2010  . left knee  2002    x 4  . left shoulder  2004  . MAXILLARY ANTROSTOMY  11/28/2017   Procedure: BILATERAL MAXILLARY ANTROSTOMY;  Surgeon: Melida Quitter, MD;  Location: [place];  Service: ENT;;  . NASAL TURBINATE REDUCTION Bilateral 11/28/2017   Procedure: [place];  Surgeon: Melida Quitter, MD;  Location: [place];  Service: ENT;  Laterality: Bilateral;  . NECK SURGERY  2003 & 2005  . NOSE SURGERY   14 and 53 yrs old  . right knee  1987  . Right shoulder rotator cuff repair    . shock wave on right foot  2009  . SINUS ENDO WITH FUSION N/A 11/28/2017   Procedure: SINUS ENDO WITH FUSION;  Surgeon: Melida Quitter, MD;  Location: [place];  Service: ENT;  Laterality: N/A;  . sinus surgeries  2005 &2007  . TONSILLECTOMY AND ADENOIDECTOMY  1974    Family History  Problem Relation Age of Onset  . Hypertension Mother   . Diabetes Mother        borderline  . Asthma Mother   . Arthritis Mother   . Hypertension Father   . Arthritis Father   . Cancer Father        prostate  . Heart disease Father        s/p MI  . Asthma Daughter   . Migraines Daughter   . GER disease Daughter   . Cancer Son        CA- calcification, right lower ribs was causing significant breathing trouble after returning from [place]  . Heart murmur Son   . Heart disease Maternal Grandmother   . Diabetes Maternal Grandfather   . Cancer Paternal Grandmother        stomach  . Asthma Daughter   . COPD Brother        smoker, lung disease     Current Outpatient Medications:  .  albuterol (PROVENTIL HFA;VENTOLIN HFA) 108 (90 Base) MCG/ACT inhaler, TAKE 2 PUFFS BY MOUTH EVERY 6 HOURS AS NEEDED FOR WHEEZE OR SHORTNESS OF BREATH, Disp: 8.5 Inhaler, Rfl: 0 .  LORazepam (ATIVAN) 0.5 MG tablet, 1-2 tabs po bid prn, Disp: 15 tablet, Rfl: 0 .  OVER THE COUNTER MEDICATION, CBD drops, Disp: , Rfl:  .  meclizine (ANTIVERT) 25 MG tablet, Take 1 tablet (25 mg total) by mouth 3 (three) times daily as needed for dizziness. (Patient not taking: Reported on 12/29/2018), Disp: 60 tablet, Rfl: 1 .  ondansetron (ZOFRAN) 4 MG tablet, Take 1 tablet (4 mg total) by mouth every 8 (eight) hours as needed for nausea or vomiting. (Patient not taking: Reported on 12/29/2018), Disp: 20 tablet, Rfl: 0 .  PARoxetine (PAXIL) 20 MG tablet, Take 1 tablet (20 mg total) by mouth daily. OFFICE VISIT NEEDED (Patient not taking: Reported on  11/29/2018), Disp: 30 tablet, Rfl: 0  EXAM:  VITALS per patient if applicable:  GENERAL: alert, oriented, appears well and in no acute distress  HEENT: atraumatic, conjunttiva clear, no obvious abnormalities on inspection of external nose and ears Her picture of her throat that she shows me shows diffuse erythema over soft palate and pharynx, without swelling or exudate or PND.  NECK: normal movements of the head and neck  LUNGS: on inspection no signs of respiratory distress, breathing rate appears normal, no obvious gross SOB, gasping or wheezing  CV: no obvious cyanosis  MS:  moves all visible extremities without noticeable abnormality  PSYCH/NEURO: pleasant and cooperative, no obvious depression or anxiety, speech and thought processing grossly intact  ASSESSMENT AND PLAN:  Discussed the following assessment and plan:  1) Acute pharyngitis, recent exposure to strep+ granddaughter. Will rx amoxil 875mg  bid. I informed pt that this still could very likely be a viral infection. Symptomatic care discussed.  2) MDD, recurrent.  Most of this is situational this time. She is in marital counseling. I'll restart her paxil at 20mg  qd and have her f/u in 1 mo.    I discussed the assessment and treatment plan with the patient. The patient was provided an opportunity to ask questions and all were answered. The patient agreed with the plan and demonstrated an understanding of the instructions.   The patient was advised to call back or seek an in-person evaluation if the symptoms worsen or if the condition fails to improve as anticipated.  F/u: 1 mo  Signed:  Crissie Sickles, MD           12/29/2018

## 2019-01-25 ENCOUNTER — Other Ambulatory Visit: Payer: Self-pay | Admitting: Family Medicine

## 2019-01-26 ENCOUNTER — Ambulatory Visit: Payer: BC Managed Care – PPO | Admitting: Family Medicine

## 2019-02-01 ENCOUNTER — Other Ambulatory Visit: Payer: Self-pay

## 2019-02-01 ENCOUNTER — Ambulatory Visit (INDEPENDENT_AMBULATORY_CARE_PROVIDER_SITE_OTHER): Payer: BC Managed Care – PPO | Admitting: Family Medicine

## 2019-02-01 ENCOUNTER — Encounter: Payer: Self-pay | Admitting: Family Medicine

## 2019-02-01 DIAGNOSIS — R519 Headache, unspecified: Secondary | ICD-10-CM

## 2019-02-01 DIAGNOSIS — R51 Headache: Secondary | ICD-10-CM

## 2019-02-01 DIAGNOSIS — G43009 Migraine without aura, not intractable, without status migrainosus: Secondary | ICD-10-CM

## 2019-02-01 DIAGNOSIS — F339 Major depressive disorder, recurrent, unspecified: Secondary | ICD-10-CM

## 2019-02-01 MED ORDER — RIZATRIPTAN BENZOATE 10 MG PO TABS: ORAL_TABLET | ORAL | 1 refills | Status: DC

## 2019-02-01 MED ORDER — PROPRANOLOL HCL ER 80 MG PO CP24: ORAL_CAPSULE | ORAL | 1 refills | Status: AC

## 2019-02-01 MED ORDER — PAROXETINE HCL 40 MG PO TABS: ORAL_TABLET | ORAL | 1 refills | Status: DC

## 2019-02-01 NOTE — Progress Notes (Signed)
Virtual Visit via Video Note  I connected with pt on 02/01/19 at  4:00 PM EDT by a video enabled telemedicine application and verified that I am speaking with the correct person using two identifiers.  Location patient: home Location provider:work or home office Persons participating in the virtual visit: patient, provider  I discussed the limitations of evaluation and management by telemedicine and the availability of in person appointments. The patient expressed understanding and agreed to proceed.   Telemedicine visit is a necessity given the COVID-19 restrictions in place at the current time.  HPI: 53 y/o WF being seen for 1 mo f/u MDD recurrence. I restarted her on paxil 20mg  qd last visit.  She is in marital counseling.  Interim hx: Still emotionally frail. Not much change on paxil so far. Appetite down, has lost lots of wt since husband cheated on her.  Not in marriage counseling anymore but marital stress/husbands affair 6 mo ago still the biggest factor for her poor mood. Occ drinking alc but not self-medicating.+Anhedonia.  Impaired sleep maintenance.  Crying spells. Has supportive kids.  Having 8 mo hx of periods of lots of HA's, some migrainous (1-2 per month).  Nausea at times, also photophobia. Entire head throbbing.  Taking excedrin sometimes, sometimes tylenol. Hx of migraine HAs, has had botox injections in scalp in the past plus a preventative med she cannot recall name of. Triggers are stress and poor sleep.  ROS: See pertinent positives and negatives per HPI.  Past Medical History:  Diagnosis Date  . Adult ADHD 02/10/2011   aquired, not neurodevelopmental.  Dr. Johnnye Sima at [facility]->adderall xr trial 11/2018.  Marland Kitchen Allergic rhinitis 10/21/2010  . Anxiety   . Arthritis   . Asthma 10/20/2010   Much worse as a child, only prn albut as adult  . Back pain 10/21/2010  . BCC (basal cell carcinoma), face 10/21/2010  . Depression 10/20/2010  . Endometriosis  10/21/2010  . Fatigue 10/21/2010  . Fibromyalgia   . Headache, migraine 10/21/2010  . Headache, tension-type 10/21/2010  . History of adenomatous polyp of colon 04/13/2018   Dr. Watt Climes.  . Hyperlipidemia, mixed    Trigs >600 in 2017: started gemfibrozil, then pt lost to f/u.  Trigs 403 Jan 20, 2018 at GYN.  . Insomnia 02/10/2011  . Irritable bowel syndrome (IBS) 10/21/2010  . Neck pain, chronic 10/20/2010  . Overweight(278.02) 10/21/2010  . Peripheral edema 10/21/2010  . Plantar fasciitis 10/21/2010  . 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 Recurrent sinusitis 10/21/2010   surgery 11/2017; path -->chronic sinusitis, no malignancy.  . Right shoulder pain 10/20/2010  . Tobacco abuse, in remission 2019   Quit 05/2017  . Urge incontinence 10/21/2010  . Vertigo    following an episode of presyncope---ED eval 11/2016 ok, as was noncontrast brain MRI.    Past Surgical History:  Procedure Laterality Date  . ABDOMINAL HYSTERECTOMY  2004   for endometriosis  . CESAREAN SECTION  1988  . COLONOSCOPY     screening TCS planned as of 01/2018 f/u with Dr. Watt Climes  . COLONOSCOPY  04/10/2018   Adenomatous polyp: repeat in 5 years  . DILATION AND CURETTAGE OF UTERUS  1994 and 1995  . ETHMOIDECTOMY  11/28/2017   Procedure: BILATERAL TOTAL ETHMOIDECTOMY;  Surgeon: Melida Quitter, MD;  Location: [place];  Service: ENT;;  . FRONTAL SINUS EXPLORATION  11/28/2017   Procedure: BILATERAL FRONTAL SINUS EXPLORATION;  Surgeon: Melida Quitter, MD;  Location: [place];  Service: ENT;;  .  left foot surgery  2010  . left knee  2002    x 4  . left shoulder  2004  . MAXILLARY ANTROSTOMY  11/28/2017   Procedure: BILATERAL MAXILLARY ANTROSTOMY;  Surgeon: Melida Quitter, MD;  Location: [place];  Service: ENT;;  . NASAL TURBINATE REDUCTION Bilateral 11/28/2017   Procedure: [place];  Surgeon: Melida Quitter, MD;  Location: [place];  Service: ENT;   Laterality: Bilateral;  . NECK SURGERY  2003 & 2005  . NOSE SURGERY  14 and 53 yrs old  . right knee  1987  . Right shoulder rotator cuff repair    . shock wave on right foot  2009  . SINUS ENDO WITH FUSION N/A 11/28/2017   Procedure: SINUS ENDO WITH FUSION;  Surgeon: Melida Quitter, MD;  Location: [place];  Service: ENT;  Laterality: N/A;  . sinus surgeries  2005 &2007  . TONSILLECTOMY AND ADENOIDECTOMY  1974    Family History  Problem Relation Age of Onset  . Hypertension Mother   . Diabetes Mother        borderline  . Asthma Mother   . Arthritis Mother   . Hypertension Father   . Arthritis Father   . Cancer Father        prostate  . Heart disease Father        s/p MI  . Asthma Daughter   . Migraines Daughter   . GER disease Daughter   . Cancer Son        CA- calcification, right lower ribs was causing significant breathing trouble after returning from [place]  . Heart murmur Son   . Heart disease Maternal Grandmother   . Diabetes Maternal Grandfather   . Cancer Paternal Grandmother        stomach  . Asthma Daughter   . COPD Brother        smoker, lung disease    SOCIAL HX:  Social History   Socioeconomic History  . Marital status: Married    Spouse name: Not on file  . Number of children: Not on file  . Years of education: Not on file  . Highest education level: Not on file  Occupational History  . Not on file  Social Needs  . Financial resource strain: Not on file  . Food insecurity    Worry: Not on file    Inability: Not on file  . Transportation needs    Medical: Not on file    Non-medical: Not on file  Tobacco Use  . Smoking status: Former Smoker    Packs/day: 1.00    Years: 30.00    Pack years: 30.00    Types: Cigarettes    Quit date: 05/16/2017    Years since quitting: 1.7  . Smokeless tobacco: Never Used  Substance and Sexual Activity  . Alcohol use: Yes    Comment: occasional  . Drug use: No  . Sexual activity: Yes     Birth control/protection: Surgical  Lifestyle  . Physical activity    Days per week: Not on file    Minutes per session: Not on file  . Stress: Not on file  Relationships  . Social Herbalist on phone: Not on file    Gets together: Not on file    Attends religious service: Not on file    Active member of club or organization: Not on file    Attends meetings of clubs  or organizations: Not on file    Relationship status: Not on file  Other Topics Concern  . Not on file  Social History Narrative   Married, 4 children.   Orig from [place] area.   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   Tobacco: 30 pack-yr hx (current as of 07/2013).    Occasional alcohol.  No drugs.      Current Outpatient Medications:  .  albuterol (PROVENTIL HFA;VENTOLIN HFA) 108 (90 Base) MCG/ACT inhaler, TAKE 2 PUFFS BY MOUTH EVERY 6 HOURS AS NEEDED FOR WHEEZE OR SHORTNESS OF BREATH, Disp: 8.5 Inhaler, Rfl: 0 .  LORazepam (ATIVAN) 0.5 MG tablet, 1-2 tabs po bid prn, Disp: 15 tablet, Rfl: 0 .  OVER THE COUNTER MEDICATION, CBD drops, Disp: , Rfl:  .  PARoxetine (PAXIL) 20 MG tablet, Take 1 tablet (20 mg total) by mouth daily., Disp: 30 tablet, Rfl: 1 .  meclizine (ANTIVERT) 25 MG tablet, Take 1 tablet (25 mg total) by mouth 3 (three) times daily as needed for dizziness. (Patient not taking: Reported on 12/29/2018), Disp: 60 tablet, Rfl: 1 .  ondansetron (ZOFRAN) 4 MG tablet, Take 1 tablet (4 mg total) by mouth every 8 (eight) hours as needed for nausea or vomiting. (Patient not taking: Reported on 12/29/2018), Disp: 20 tablet, Rfl: 0  EXAM:  VITALS per patient if applicable: There were no vitals taken for this visit.   GENERAL: alert, oriented, appears well and in no acute distress  HEENT: atraumatic, conjunttiva clear, no obvious abnormalities on inspection of external nose and ears  NECK: normal movements of the head and neck  LUNGS: on inspection no signs of respiratory distress,  breathing rate appears normal, no obvious gross SOB, gasping or wheezing  CV: no obvious cyanosis  MS: moves all visible extremities without noticeable abnormality  PSYCH/NEURO: pleasant and cooperative, no obvious depression or anxiety, speech and thought processing grossly intact  LABS: none today    Chemistry      Component Value Date/Time   NA 140 01/20/2018   K 4.6 01/20/2018   CL 104 11/23/2016 1810   CO2 24 11/23/2016 1810   BUN 12 01/20/2018   CREATININE 0.8 01/20/2018   CREATININE 0.85 11/23/2016 1810   CREATININE 0.91 11/23/2016 1635   GLU 96 01/20/2018      Component Value Date/Time   CALCIUM 9.0 11/23/2016 1810   ALKPHOS 104 01/20/2018   AST 19 01/20/2018   ALT 18 01/20/2018   BILITOT 0.2 11/23/2016 1635      ASSESSMENT AND PLAN:  Discussed the following assessment and plan:  1) MDD, recurrent.   Increase paxil to 40mg  qd. She declined counseling referral.  2) Chronic daily HA's.  Some migrainous. Use excedrin as first line. Maxalt as 2nd line abortive med--eRx'd today. Start Propranolol ER 80 mg qhs.    I discussed the assessment and treatment plan with the patient. The patient was provided an opportunity to ask questions and all were answered. The patient agreed with the plan and demonstrated an understanding of the instructions.   The patient was advised to call back or seek an in-person evaluation if the symptoms worsen or if the condition fails to improve as anticipated.  F/u: 1 mo  Signed:  Crissie Sickles, MD           02/01/2019

## 2019-02-24 ENCOUNTER — Other Ambulatory Visit: Payer: Self-pay | Admitting: Family Medicine

## 2019-03-13 ENCOUNTER — Telehealth: Payer: Self-pay | Admitting: Family Medicine

## 2019-03-13 NOTE — Telephone Encounter (Signed)
Patient requested to come by and get her throat swabbed. I asked her for her symptoms. Patient reported to has fever,ears hurt and she could hardly swallow times 3 days. I told her we could schedule a VV for her, but as long as she is having symptoms such as (above), I could not schedule an in office appt. She became agitated and said,"well if I cant come in person, then I will just take an antibiotic" She has meds at home.   Please call and advise.

## 2019-03-13 NOTE — Telephone Encounter (Signed)
FYI. Please see below.  Contacted patient to offer virtual visit or recommend being seen at an urgent care. Pt declined stating " she didn't understand why she couldn't be seen in office and had to go to an urgent care to be swabbed or for an appointment". It was explained to her that with the symptoms she reported, an in office visit would not be appropriate. She became more agitated and said she would find another PCP.

## 2019-03-14 NOTE — Telephone Encounter (Signed)
Noted  

## 2019-03-31 ENCOUNTER — Other Ambulatory Visit: Payer: Self-pay | Admitting: Family Medicine

## 2019-04-03 ENCOUNTER — Other Ambulatory Visit: Payer: Self-pay | Admitting: Family Medicine

## 2019-04-16 ENCOUNTER — Other Ambulatory Visit: Payer: Self-pay | Admitting: Family Medicine

## 2019-06-06 LAB — CBC AND DIFFERENTIAL: Hemoglobin: 12.7 (ref 12.0–16.0)

## 2019-06-07 ENCOUNTER — Encounter: Payer: Self-pay | Admitting: Family Medicine

## 2019-06-20 ENCOUNTER — Encounter: Payer: Self-pay | Admitting: Family Medicine

## 2019-06-20 NOTE — Telephone Encounter (Signed)
Patient is requesting 2 letters, one for her absences and the other stating she had to be in quaratine due to caring for parents and was exposed.  Please advise, thanks.

## 2019-06-21 NOTE — Telephone Encounter (Signed)
Britt, pls write both letters that the patient requested.-thx!

## 2019-06-22 NOTE — Telephone Encounter (Signed)
Patient requesting refill   PARoxetine (PAXIL) 40 MG tablet [id number]   Because of COVID and quarantine regulations for her and her family, she couldn't come in. Scheduled appt on 07/02/19 with Dr. Anitra Lauth  [facility]

## 2019-06-25 ENCOUNTER — Other Ambulatory Visit: Payer: Self-pay

## 2019-06-25 MED ORDER — PAROXETINE HCL 40 MG PO TABS: ORAL_TABLET | ORAL | 0 refills | Status: AC

## 2019-07-02 ENCOUNTER — Ambulatory Visit (INDEPENDENT_AMBULATORY_CARE_PROVIDER_SITE_OTHER): Payer: BC Managed Care – PPO | Admitting: Family Medicine

## 2019-07-02 ENCOUNTER — Other Ambulatory Visit: Payer: Self-pay

## 2019-07-02 ENCOUNTER — Encounter: Payer: Self-pay | Admitting: Family Medicine

## 2019-07-02 ENCOUNTER — Ambulatory Visit (HOSPITAL_BASED_OUTPATIENT_CLINIC_OR_DEPARTMENT_OTHER)
Admission: RE | Admit: 2019-07-02 | Discharge: 2019-07-02 | Disposition: A | Discharge status: Home / Self Care | Payer: BC Managed Care – PPO | Source: Ambulatory Visit | Attending: Family Medicine | Admitting: Family Medicine

## 2019-07-02 VITALS — BP 117/79 | HR 97 | Temp 98.0°F | Resp 16 | Ht 64.0 in | Wt 175.4 lb

## 2019-07-02 DIAGNOSIS — R0609 Other forms of dyspnea: Secondary | ICD-10-CM

## 2019-07-02 DIAGNOSIS — R0600 Dyspnea, unspecified: Secondary | ICD-10-CM

## 2019-07-02 DIAGNOSIS — Z79899 Other long term (current) drug therapy: Secondary | ICD-10-CM

## 2019-07-02 DIAGNOSIS — F17201 Nicotine dependence, unspecified, in remission: Secondary | ICD-10-CM

## 2019-07-02 DIAGNOSIS — R0789 Other chest pain: Secondary | ICD-10-CM | POA: Diagnosis not present

## 2019-07-02 DIAGNOSIS — Z23 Encounter for immunization: Secondary | ICD-10-CM | POA: Diagnosis not present

## 2019-07-02 DIAGNOSIS — F411 Generalized anxiety disorder: Secondary | ICD-10-CM

## 2019-07-02 LAB — CBC WITH DIFFERENTIAL/PLATELET
Basophils Absolute: 0.1 10*3/uL (ref 0.0–0.1)
Basophils Relative: 0.7 % (ref 0.0–3.0)
Eosinophils Absolute: 0.3 10*3/uL (ref 0.0–0.7)
Eosinophils Relative: 3 % (ref 0.0–5.0)
HCT: 41.3 % (ref 36.0–46.0)
Hemoglobin: 13.7 g/dL (ref 12.0–15.0)
Lymphocytes Relative: 38.7 % (ref 12.0–46.0)
Lymphs Abs: 3.6 10*3/uL (ref 0.7–4.0)
MCHC: 33.3 g/dL (ref 30.0–36.0)
MCV: 87.6 fl (ref 78.0–100.0)
Monocytes Absolute: 0.5 10*3/uL (ref 0.1–1.0)
Monocytes Relative: 5.3 % (ref 3.0–12.0)
Neutro Abs: 4.9 10*3/uL (ref 1.4–7.7)
Neutrophils Relative %: 52.3 % (ref 43.0–77.0)
Platelets: 304 10*3/uL (ref 150.0–400.0)
RBC: 4.71 Mil/uL (ref 3.87–5.11)
RDW: 14.9 % (ref 11.5–15.5)
WBC: 9.3 10*3/uL (ref 4.0–10.5)

## 2019-07-02 LAB — BASIC METABOLIC PANEL
BUN: 11 mg/dL (ref 6–23)
CO2: 26 mEq/L (ref 19–32)
Calcium: 9.4 mg/dL (ref 8.4–10.5)
Chloride: 104 mEq/L (ref 96–112)
Creatinine, Ser: 0.8 mg/dL (ref 0.40–1.20)
GFR: 74.71 mL/min (ref >60.00)
Glucose, Bld: 85 mg/dL (ref 70–99)
Potassium: 4.4 mEq/L (ref 3.5–5.1)
Sodium: 140 mEq/L (ref 135–145)

## 2019-07-02 NOTE — Progress Notes (Signed)
OFFICE VISIT  07/02/2019   CC:  Chief Complaint  Patient presents with  . Follow-up    RCI, pt is fasting     HPI:    Patient is a 54 y.o. Caucasian female who presents for f/u recurrent MDD and recurrent headaches. I last saw her 5 mo ago but she was supposed to have followed up 1 mo after last visit. A/P as of last visit: "1) MDD, recurrent.   Increase paxil to 40mg  qd. She declined counseling referral.  2) Chronic daily HA's.  Some migrainous. Use excedrin as first line. Maxalt as 2nd line abortive med--eRx'd today. Start Propranolol ER 80 mg qhs."  Interim hx: Says 40 mg paxil qd works well.  Attitude, overall sense of well being improved. No self medicating. She has not used lorazepam much at all but does want RF of this. She says she takes one occ with severe stress at work and also when situation with husband flares (he cheated on her).  PMP AWARE reviewed today: most recent rx for lorazepam was filled 10/18/18, # 20, rx by her dentist. Most recent [place] [facility] rf by me was 10/18/18, #15. No red flags.  Several years hx of:  She does have some SOB with minimal exertion.  No chronic cough.  Sometimes wheezes, mainly when she laughs.   She quit smoking 6 weeks ago. Says sOB issue worse since having covid in 2020. Has non-exertinal L sided CP several times per month, sometimes more of focal area that's tender and other times more signif and diffuse L sided CP with increased SOB.  No arm or jaw pain.  Occ nausea when it happens. No palpitations.  Occ dizziness when it happens.   Does get anxious from it but DOES NOT think any increased anxiety makes it happen. She last exercised 2 mo ago or so.  This did not elicit any CP.  Review of Systems  Constitutional: Negative for appetite change, chills, fatigue and fever.  HENT: Negative for congestion, dental problem, ear pain and sore throat.   Eyes: Negative for discharge, redness and visual disturbance.  Respiratory: Positive for  cough. Negative for chest tightness, shortness of breath and wheezing.   Cardiovascular: Positive for chest pain. Negative for palpitations and leg swelling.  Gastrointestinal: Negative for abdominal pain, blood in stool, diarrhea, nausea and vomiting.  Genitourinary: Negative for difficulty urinating, dysuria, flank pain, frequency, hematuria and urgency.  Musculoskeletal: Negative for arthralgias, back pain, joint swelling, myalgias and neck stiffness.  Skin: Negative for pallor and rash.  Neurological: Negative for dizziness, speech difficulty, weakness and headaches.  Hematological: Negative for adenopathy. Does not bruise/bleed easily.  Psychiatric/Behavioral: Positive for dysphoric mood. Negative for confusion and sleep disturbance. The patient is nervous/anxious.      Past Medical History:  Diagnosis Date  . Adult ADHD 02/10/2011   aquired, not neurodevelopmental.  Dr. Johnnye Sima at [facility]->adderall xr trial 11/2018.  Marland Kitchen Allergic rhinitis 10/21/2010  . Anxiety   . Arthritis   . Asthma 10/20/2010   Much worse as a child, only prn albut as adult  . Back pain 10/21/2010  . BCC (basal cell carcinoma), face 10/21/2010  . Depression 10/20/2010  . Endometriosis 10/21/2010  . Fatigue 10/21/2010  . Fibromyalgia   . Headache, migraine 10/21/2010  . Headache, tension-type 10/21/2010  . History of adenomatous polyp of colon 04/13/2018   Dr. Watt Climes.  . Hyperlipidemia, mixed    Trigs >600 in 2017: started gemfibrozil, then pt lost to f/u.  Trigs 403 Jan 20, 2018 at GYN.  . Insomnia 02/10/2011  . Irritable bowel syndrome (IBS) 10/21/2010  . Neck pain, chronic 10/20/2010  . Overweight(278.02) 10/21/2010  . Peripheral edema 10/21/2010  . Plantar fasciitis 10/21/2010  . 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 Recurrent sinusitis 10/21/2010   surgery 11/2017; path -->chronic sinusitis, no malignancy.  . Right shoulder pain 10/20/2010  . Tobacco abuse, in remission 2019   Quit 05/2017  . Urge incontinence 10/21/2010   . Vertigo    following an episode of presyncope---ED eval 11/2016 ok, as was noncontrast brain MRI.    Past Surgical History:  Procedure Laterality Date  . ABDOMINAL HYSTERECTOMY  2004   for endometriosis  . CESAREAN SECTION  1988  . COLONOSCOPY     screening TCS planned as of 01/2018 f/u with Dr. Watt Climes  . COLONOSCOPY  04/10/2018   Adenomatous polyp: repeat in 5 years  . DILATION AND CURETTAGE OF UTERUS  1994 and 1995  . ETHMOIDECTOMY  11/28/2017   Procedure: BILATERAL TOTAL ETHMOIDECTOMY;  Surgeon: Melida Quitter, MD;  Location: [place];  Service: ENT;;  . FRONTAL SINUS EXPLORATION  11/28/2017   Procedure: BILATERAL FRONTAL SINUS EXPLORATION;  Surgeon: Melida Quitter, MD;  Location: [place];  Service: ENT;;  . left foot surgery  2010  . left knee  2002    x 4  . left shoulder  2004  . MAXILLARY ANTROSTOMY  11/28/2017   Procedure: BILATERAL MAXILLARY ANTROSTOMY;  Surgeon: Melida Quitter, MD;  Location: [place];  Service: ENT;;  . NASAL TURBINATE REDUCTION Bilateral 11/28/2017   Procedure: [place];  Surgeon: Melida Quitter, MD;  Location: [place];  Service: ENT;  Laterality: Bilateral;  . NECK SURGERY  2003 & 2005  . NOSE SURGERY  14 and 54 yrs old  . right knee  1987  . Right shoulder rotator cuff repair    . shock wave on right foot  2009  . SINUS ENDO WITH FUSION N/A 11/28/2017   Procedure: SINUS ENDO WITH FUSION;  Surgeon: Melida Quitter, MD;  Location: [place];  Service: ENT;  Laterality: N/A;  . sinus surgeries  2005 &2007  . TONSILLECTOMY AND ADENOIDECTOMY  1974    Outpatient Medications Prior to Visit  Medication Sig Dispense Refill  . albuterol (PROVENTIL HFA;VENTOLIN HFA) 108 (90 Base) MCG/ACT inhaler TAKE 2 PUFFS BY MOUTH EVERY 6 HOURS AS NEEDED FOR WHEEZE OR SHORTNESS OF BREATH 8.5 Inhaler 0  . LORazepam (ATIVAN) 0.5 MG tablet 1-2 tabs po bid prn 15  tablet 0  . PARoxetine (PAXIL) 40 MG tablet TAKE 1 TABLET BY MOUTH EVERY DAY. OFFICE VISIT NEEDED 30 tablet 0  . propranolol ER (INDERAL LA) 80 MG 24 hr capsule 1 tab po qhs for headache prevention 30 capsule 1  . rizatriptan (MAXALT) 10 MG tablet 1 tab po qd at the onset of migraine HA, may repeat in 2H if needed. 10 tablet 1  . meclizine (ANTIVERT) 25 MG tablet Take 1 tablet (25 mg total) by mouth 3 (three) times daily as needed for dizziness. (Patient not taking: Reported on 12/29/2018) 60 tablet 1  . ondansetron (ZOFRAN) 4 MG tablet Take 1 tablet (4 mg total) by mouth every 8 (eight) hours as needed for nausea or vomiting. (Patient not taking: Reported on 12/29/2018) 20 tablet 0  . OVER THE COUNTER MEDICATION CBD drops     No facility-administered medications prior to visit.  Allergies  Allergen Reactions  . Codeine Hives  . Darvocet [Propoxyphene N-Acetaminophen] Nausea And Vomiting  . Percocet [Oxycodone-Acetaminophen] Nausea And Vomiting  . Hydrocodone Nausea And Vomiting    ROS As per HPI  PE: Blood pressure 117/79, pulse 97, temperature 98 F (36.7 C), temperature source Temporal, resp. rate 16, height 5\' 4"  (1.626 m), weight 175 lb 6.4 oz (79.6 kg), SpO2 97 %. Gen: Alert, well appearing.  Patient is oriented to person, place, time, and situation. AFFECT: pleasant, lucid thought and speech. CV: RRR, no m/r/g.   LUNGS: CTA bilat, nonlabored resps, good aeration in all lung fields. With a very strongly forced exhalation I can hear what sounds like wheezing of UPPER AIRWAY location. EXT: no clubbing or cyanosis.  no edema.    LABS:  Lab Results  Component Value Date   TSH 2.69 01/03/2015   Lab Results  Component Value Date   WBC 10.5 11/23/2016   HGB 12.7 06/06/2019   HCT 39.5 11/23/2016   MCV 85.3 11/23/2016   PLT 267 11/23/2016   Lab Results  Component Value Date   CREATININE 0.8 01/20/2018   BUN 12 01/20/2018   NA 140 01/20/2018   K 4.6 01/20/2018   CL 104  11/23/2016   CO2 24 11/23/2016   Lab Results  Component Value Date   ALT 18 01/20/2018   AST 19 01/20/2018   ALKPHOS 104 01/20/2018   BILITOT 0.2 11/23/2016   Lab Results  Component Value Date   CHOL 286 (A) 01/20/2018   Lab Results  Component Value Date   HDL 41 01/20/2018   No results found for: [facility] Lab Results  Component Value Date   TRIG 403 (A) 01/20/2018   Lab Results  Component Value Date   CHOLHDL 8 01/03/2015   Lab Results  Component Value Date   HGBA1C 5.7 01/04/2012   12 lead EKG today:  NSR, rate 72, no ischemic changes.  Intervals and duration normal.  No hypertrophy. Low voltage V1, II, III, and aVF.  Isolated TWI in V1.   No change compared to EKG 11/23/16.  IMPRESSION AND PLAN:  1) GAD/recurrent MDD: partial remission. Continue paxil 40mg  qd, she'll keep working with husband on ongoing marital issues. Lorazepam 0.5mg , 1-2 bid prn, #60, RF x 1. Discussed CSC but did not get this done today b/c of distraction with other w/u--pt knows this is needed and agrees to get it done at f/u visit. UDS today (should be completely clear--including no lorazepam).  2) DOE: long term smoking hx (quit 6 wks ago).  Suspect COPD but with atypical CP issue I would like to try to get some objective evidence of this-->PFTs ordered. CXR today. CBC w/diff and BMET today. Flu vaccine given today.  3) Atypical CP: EKG today ->reassuring. If w/u does not support dx of COPD then consider further cardiac w/u.  An After Visit Summary was printed and given to the patient.  FOLLOW UP: Return in about 4 weeks (around 07/30/2019) for f/u DOE/atypical CP.  Signed:  Crissie Sickles, MD           07/02/2019

## 2019-07-03 LAB — PAIN MGMT, PROFILE 8 W/CONF, U
6 Acetylmorphine: NEGATIVE ng/mL
Alcohol Metabolites: NEGATIVE ng/mL (ref <500)
Amphetamines: NEGATIVE ng/mL
Benzodiazepines: NEGATIVE ng/mL
Buprenorphine, Urine: NEGATIVE ng/mL
Cocaine Metabolite: NEGATIVE ng/mL
Creatinine: 211.1 mg/dL
MDMA: NEGATIVE ng/mL
Marijuana Metabolite: NEGATIVE ng/mL
Opiates: NEGATIVE ng/mL
Oxidant: NEGATIVE ug/mL
Oxycodone: NEGATIVE ng/mL
pH: 5.6 (ref 4.5–9.0)

## 2019-07-05 ENCOUNTER — Encounter: Payer: Self-pay | Admitting: Family Medicine

## 2019-07-06 MED ORDER — LORAZEPAM 0.5 MG PO TABS: ORAL_TABLET | ORAL | 1 refills | Status: AC

## 2019-07-06 NOTE — Telephone Encounter (Signed)
OK. Lorazepam eRx'd. Pls do note stating pt has medical condition that causes breathing problems and please allow her to wear a mask only over her mouth while at work.-thx

## 2019-07-06 NOTE — Telephone Encounter (Signed)
Patient had recent visit on 07/02/19. Refill has not been sent for Lorazepam to [facility]. Please advise on note for patient.

## 2019-07-30 ENCOUNTER — Ambulatory Visit: Payer: BC Managed Care – PPO | Admitting: Family Medicine

## 2019-07-30 NOTE — Progress Notes (Deleted)
OFFICE VISIT  07/30/2019   CC: No chief complaint on file.  HPI:    Patient is a 54 y.o. Caucasian female who presents for 4 wk f/u atypical CP and DOE. A/P as of last visit: "1) GAD/recurrent MDD: partial remission. Continue paxil 40mg  qd, she'll keep working with husband on ongoing marital issues. Lorazepam 0.5mg , 1-2 bid prn, #60, RF x 1. Discussed CSC but did not get this done today b/c of distraction with other w/u--pt knows this is needed and agrees to get it done at f/u visit. UDS today (should be completely clear--including no lorazepam).  2) DOE: long term smoking hx (quit 6 wks ago).  Suspect COPD but with atypical CP issue I would like to try to get some objective evidence of this-->PFTs ordered. CXR today. CBC w/diff and BMET today. Flu vaccine given today.  3) Atypical CP: EKG today ->reassuring. If w/u does not support dx of COPD then consider further cardiac w/u."  Interim hx: Labs and CXR last visit all normal. PFTs have not been done.  ***  Past Medical History:  Diagnosis Date  . Adult ADHD 02/10/2011   aquired, not neurodevelopmental.  Dr. Johnnye Sima at [facility]->adderall xr trial 11/2018.  Marland Kitchen Allergic rhinitis 10/21/2010  . Anxiety   . Arthritis   . Asthma 10/20/2010   Much worse as a child, only prn albut as adult  . Back pain 10/21/2010  . BCC (basal cell carcinoma), face 10/21/2010  . Depression 10/20/2010  . Endometriosis 10/21/2010  . Fatigue 10/21/2010  . Fibromyalgia   . Headache, migraine 10/21/2010  . Headache, tension-type 10/21/2010  . History of adenomatous polyp of colon 04/13/2018   Dr. Watt Climes.  . Hyperlipidemia, mixed    Trigs >600 in 2017: started gemfibrozil, then pt lost to f/u.  Trigs 403 Jan 20, 2018 at GYN.  . Insomnia 02/10/2011  . Irritable bowel syndrome (IBS) 10/21/2010  . Neck pain, chronic 10/20/2010  . Overweight(278.02) 10/21/2010  . Peripheral edema 10/21/2010  . Plantar fasciitis 10/21/2010  . 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  Recurrent sinusitis 10/21/2010   surgery 11/2017; path -->chronic sinusitis, no malignancy.  . Right shoulder pain 10/20/2010  . Tobacco abuse, in remission 2019   Quit 05/2017  . Urge incontinence 10/21/2010  . Vertigo    following an episode of presyncope---ED eval 11/2016 ok, as was noncontrast brain MRI.    Past Surgical History:  Procedure Laterality Date  . ABDOMINAL HYSTERECTOMY  2004   for endometriosis  . CESAREAN SECTION  1988  . COLONOSCOPY     screening TCS planned as of 01/2018 f/u with Dr. Watt Climes  . COLONOSCOPY  04/10/2018   Adenomatous polyp: repeat in 5 years  . DILATION AND CURETTAGE OF UTERUS  1994 and 1995  . ETHMOIDECTOMY  11/28/2017   Procedure: BILATERAL TOTAL ETHMOIDECTOMY;  Surgeon: Melida Quitter, MD;  Location: [place];  Service: ENT;;  . FRONTAL SINUS EXPLORATION  11/28/2017   Procedure: BILATERAL FRONTAL SINUS EXPLORATION;  Surgeon: Melida Quitter, MD;  Location: [place];  Service: ENT;;  . left foot surgery  2010  . left knee  2002    x 4  . left shoulder  2004  . MAXILLARY ANTROSTOMY  11/28/2017   Procedure: BILATERAL MAXILLARY ANTROSTOMY;  Surgeon: Melida Quitter, MD;  Location: [place];  Service: ENT;;  . NASAL TURBINATE REDUCTION Bilateral 11/28/2017   Procedure: [place];  Surgeon: Melida Quitter, MD;  Location: [facility]  [place];  Service: ENT;  Laterality: Bilateral;  . NECK SURGERY  2003 & 2005  . NOSE SURGERY  14 and 54 yrs old  . right knee  1987  . Right shoulder rotator cuff repair    . shock wave on right foot  2009  . SINUS ENDO WITH FUSION N/A 11/28/2017   Procedure: SINUS ENDO WITH FUSION;  Surgeon: Melida Quitter, MD;  Location: [place];  Service: ENT;  Laterality: N/A;  . sinus surgeries  2005 &2007  . TONSILLECTOMY AND ADENOIDECTOMY  1974    Outpatient Medications Prior to Visit  Medication Sig Dispense Refill  . albuterol  (PROVENTIL HFA;VENTOLIN HFA) 108 (90 Base) MCG/ACT inhaler TAKE 2 PUFFS BY MOUTH EVERY 6 HOURS AS NEEDED FOR WHEEZE OR SHORTNESS OF BREATH 8.5 Inhaler 0  . LORazepam (ATIVAN) 0.5 MG tablet 1-2 tabs po bid prn 60 tablet 1  . meclizine (ANTIVERT) 25 MG tablet Take 1 tablet (25 mg total) by mouth 3 (three) times daily as needed for dizziness. (Patient not taking: Reported on 12/29/2018) 60 tablet 1  . ondansetron (ZOFRAN) 4 MG tablet Take 1 tablet (4 mg total) by mouth every 8 (eight) hours as needed for nausea or vomiting. (Patient not taking: Reported on 12/29/2018) 20 tablet 0  . OVER THE COUNTER MEDICATION CBD drops    . PARoxetine (PAXIL) 40 MG tablet TAKE 1 TABLET BY MOUTH EVERY DAY. OFFICE VISIT NEEDED 30 tablet 0  . propranolol ER (INDERAL LA) 80 MG 24 hr capsule 1 tab po qhs for headache prevention 30 capsule 1  . rizatriptan (MAXALT) 10 MG tablet 1 tab po qd at the onset of migraine HA, may repeat in 2H if needed. 10 tablet 1   No facility-administered medications prior to visit.    Allergies  Allergen Reactions  . Codeine Hives  . Darvocet [Propoxyphene N-Acetaminophen] Nausea And Vomiting  . Percocet [Oxycodone-Acetaminophen] Nausea And Vomiting  . Hydrocodone Nausea And Vomiting    ROS As per HPI  PE: There were no vitals taken for this visit. ***  LABS:  Lab Results  Component Value Date   TSH 2.69 01/03/2015   Lab Results  Component Value Date   WBC 9.3 07/02/2019   HGB 13.7 07/02/2019   HCT 41.3 07/02/2019   MCV 87.6 07/02/2019   PLT 304.0 07/02/2019   Lab Results  Component Value Date   CREATININE 0.80 07/02/2019   BUN 11 07/02/2019   NA 140 07/02/2019   K 4.4 07/02/2019   CL 104 07/02/2019   CO2 26 07/02/2019   Lab Results  Component Value Date   ALT 18 01/20/2018   AST 19 01/20/2018   ALKPHOS 104 01/20/2018   BILITOT 0.2 11/23/2016   Lab Results  Component Value Date   CHOL 286 (A) 01/20/2018   Lab Results  Component Value Date   HDL 41  01/20/2018   No results found for: [facility] Lab Results  Component Value Date   TRIG 403 (A) 01/20/2018   Lab Results  Component Value Date   CHOLHDL 8 01/03/2015   Lab Results  Component Value Date   HGBA1C 5.7 01/04/2012    IMPRESSION AND PLAN:  No problem-specific Assessment & Plan notes found for this encounter.   An After Visit Summary was printed and given to the patient.  FOLLOW UP: No follow-ups on file.  Signed:  Crissie Sickles, MD           07/30/2019

## 2019-08-06 ENCOUNTER — Encounter: Payer: Self-pay | Admitting: Family Medicine

## 2019-08-06 ENCOUNTER — Other Ambulatory Visit: Payer: Self-pay

## 2019-08-06 ENCOUNTER — Ambulatory Visit (INDEPENDENT_AMBULATORY_CARE_PROVIDER_SITE_OTHER): Payer: BC Managed Care – PPO | Admitting: Family Medicine

## 2019-08-06 DIAGNOSIS — F339 Major depressive disorder, recurrent, unspecified: Secondary | ICD-10-CM | POA: Diagnosis not present

## 2019-08-06 DIAGNOSIS — J329 Chronic sinusitis, unspecified: Secondary | ICD-10-CM | POA: Diagnosis not present

## 2019-08-06 DIAGNOSIS — F411 Generalized anxiety disorder: Secondary | ICD-10-CM | POA: Diagnosis not present

## 2019-08-06 DIAGNOSIS — J449 Chronic obstructive pulmonary disease, unspecified: Secondary | ICD-10-CM

## 2019-08-06 DIAGNOSIS — J0101 Acute recurrent maxillary sinusitis: Secondary | ICD-10-CM

## 2019-08-06 MED ORDER — BUDESONIDE-FORMOTEROL FUMARATE 160-4.5 MCG/ACT IN AERO: 2.0000 | INHALATION_SPRAY | Freq: Two times a day (BID) | RESPIRATORY_TRACT | 6 refills | Status: AC

## 2019-08-06 MED ORDER — RIZATRIPTAN BENZOATE 10 MG PO TABS: ORAL_TABLET | ORAL | 1 refills | Status: AC

## 2019-08-06 MED ORDER — LEVOFLOXACIN 500 MG PO TABS: 500.0000 mg | ORAL_TABLET | Freq: Every day | ORAL | 0 refills | Status: AC

## 2019-08-06 NOTE — Progress Notes (Signed)
Virtual Visit via Video Note  I connected with pt on 08/06/19 at  4:00 PM EST by a video enabled telemedicine application and verified that I am speaking with the correct person using two identifiers.  Location patient: home Location provider:work or home office Persons participating in the virtual visit: patient, provider  I discussed the limitations of evaluation and management by telemedicine and the availability of in person appointments. The patient expressed understanding and agreed to proceed.  Telemedicine visit is a necessity given the COVID-19 restrictions in place at the current time.  HPI: 54 y/o WF being seen today for 1 mo f/u DOE, atypical CP, and recurrent MDD/GAD. A/P as of last visit: "1) GAD/recurrent MDD: partial remission. Continue paxil 40mg  qd, she'll keep working with husband on ongoing marital issues. Lorazepam 0.5mg , 1-2 bid prn, #60, RF x 1. Discussed CSC but did not get this done today b/c of distraction with other w/u--pt knows this is needed and agrees to get it done at f/u visit. UDS today (should be completely clear--including no lorazepam).  2) DOE: long term smoking hx (quit 6 wks ago).  Suspect COPD but with atypical CP issue I would like to try to get some objective evidence of this-->PFTs ordered. CXR today. CBC w/diff and BMET today. Flu vaccine given today.  3) Atypical CP: EKG today ->reassuring. If w/u does not support dx of COPD then consider further cardiac w/u."  Interim hx: PFTs have not been done, ? Not scheduling these due to covid 19 pandemic? Pt says she has not received any calls about getting this scheduled.  Has had some migraine HAs last few days.  Has chronic nasal congestion, a bit worse last couple weeks. Some mild ST on/off.  Some feeling of clogged/blocked ears. Some PND but nothing more than usual.  Max sinus pressure/peri-orbital and retro-orbital pressure and pain bilat, no cough, no fever. Some diarrhea last couple  days, this let up today.  Some nausea but no vomiting or abd pain.  Hx of recurrent/chronic sinusitis, flares in the past have only really responded to levaquin so her ENT has recommended she get this at each acute episode.  Anxiety/mood: all stable, compliant with meds.  Still with some periods of DOE, intermittent wheezing.  No SOB at rest, no CP. Has not restarted smoking. Uses albuterol prn and doesn't find it helpful.  ROS:  no dizziness, no rashes, no melena/hematochezia.  No polyuria or polydipsia.  No myalgias or arthralgias.     Past Medical History:  Diagnosis Date  . Adult ADHD 02/10/2011   aquired, not neurodevelopmental.  Dr. Johnnye Sima at [facility]->adderall xr trial 11/2018.  Marland Kitchen Allergic rhinitis 10/21/2010  . Asthma 10/20/2010   Much worse as a child, only prn albut as adult  . BCC (basal cell carcinoma), face 10/21/2010  . Chronic fatigue 10/21/2010  . Fibromyalgia   . GAD (generalized anxiety disorder)   . Headache, migraine 10/21/2010  . Headache, tension-type 10/21/2010  . History of adenomatous polyp of colon 04/13/2018   Dr. Watt Climes. Recall 5 yrs.  . History of endometriosis 10/21/2010   Pt is s/p hysterectomy  . Hyperlipidemia, mixed    Trigs >600 in 2017: started gemfibrozil, then pt lost to f/u.  Trigs 403 Jan 20, 2018 at GYN.  . Insomnia 02/10/2011  . Irritable bowel syndrome (IBS) 10/21/2010  . Neck pain, chronic 10/20/2010  . Osteoarthritis, multiple sites   . Overweight(278.02) 10/21/2010  . Peripheral edema 10/21/2010  . 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  Recurrent depression ([place]) 10/20/2010  . Recurrent sinusitis 10/21/2010   surgery 11/2017; path -->chronic sinusitis, no malignancy.  . Tobacco abuse, in remission 2019   Quit 05/2017. Restarted. Quit again summer 2020.  Marland Kitchen Urge incontinence 10/21/2010  . Vertigo    following an episode of presyncope---ED eval 11/2016 ok, as was noncontrast brain MRI.    Past Surgical History:  Procedure Laterality Date  .  ABDOMINAL HYSTERECTOMY  2004   for endometriosis  . CESAREAN SECTION  1988  . COLONOSCOPY  04/10/2018   Adenomatous polyp: repeat in 5 years  . DILATION AND CURETTAGE OF UTERUS  1994 and 1995  . ETHMOIDECTOMY  11/28/2017   Procedure: BILATERAL TOTAL ETHMOIDECTOMY;  Surgeon: Melida Quitter, MD;  Location: [place];  Service: ENT;;  . FRONTAL SINUS EXPLORATION  11/28/2017   Procedure: BILATERAL FRONTAL SINUS EXPLORATION;  Surgeon: Melida Quitter, MD;  Location: [place];  Service: ENT;;  . left foot surgery  2010  . left knee  2002    x 4  . left shoulder  2004  . MAXILLARY ANTROSTOMY  11/28/2017   Procedure: BILATERAL MAXILLARY ANTROSTOMY;  Surgeon: Melida Quitter, MD;  Location: [place];  Service: ENT;;  . NASAL TURBINATE REDUCTION Bilateral 11/28/2017   Procedure: [place];  Surgeon: Melida Quitter, MD;  Location: [place];  Service: ENT;  Laterality: Bilateral;  . NECK SURGERY  2003 & 2005  . NOSE SURGERY  14 and 54 yrs old  . right knee  1987  . Right shoulder rotator cuff repair    . shock wave on right foot  2009  . SINUS ENDO WITH FUSION N/A 11/28/2017   Procedure: SINUS ENDO WITH FUSION;  Surgeon: Melida Quitter, MD;  Location: [place];  Service: ENT;  Laterality: N/A;  . sinus surgeries  2005 &2007  . TONSILLECTOMY AND ADENOIDECTOMY  1974    Family History  Problem Relation Age of Onset  . Hypertension Mother   . Diabetes Mother        borderline  . Asthma Mother   . Arthritis Mother   . Hypertension Father   . Arthritis Father   . Cancer Father        prostate  . Heart disease Father        s/p MI  . Asthma Daughter   . Migraines Daughter   . GER disease Daughter   . Cancer Son        CA- calcification, right lower ribs was causing significant breathing trouble after returning from [place]  . Heart murmur Son   . Heart disease Maternal Grandmother   .  Diabetes Maternal Grandfather   . Cancer Paternal Grandmother        stomach  . Asthma Daughter   . COPD Brother        smoker, lung disease     Current Outpatient Medications:  .  albuterol (PROVENTIL HFA;VENTOLIN HFA) 108 (90 Base) MCG/ACT inhaler, TAKE 2 PUFFS BY MOUTH EVERY 6 HOURS AS NEEDED FOR WHEEZE OR SHORTNESS OF BREATH, Disp: 8.5 Inhaler, Rfl: 0 .  LORazepam (ATIVAN) 0.5 MG tablet, 1-2 tabs po bid prn, Disp: 60 tablet, Rfl: 1 .  PARoxetine (PAXIL) 40 MG tablet, TAKE 1 TABLET BY MOUTH EVERY DAY. OFFICE VISIT NEEDED, Disp: 30 tablet, Rfl: 0 .  propranolol ER (INDERAL LA) 80 MG 24 hr capsule, 1 tab po qhs for headache prevention, Disp: 30 capsule, Rfl: 1 .  rizatriptan (MAXALT) 10 MG tablet, 1 tab po qd at the onset of migraine HA, may repeat in 2H if needed., Disp: 10 tablet, Rfl: 1 .  meclizine (ANTIVERT) 25 MG tablet, Take 1 tablet (25 mg total) by mouth 3 (three) times daily as needed for dizziness. (Patient not taking: Reported on 12/29/2018), Disp: 60 tablet, Rfl: 1 .  ondansetron (ZOFRAN) 4 MG tablet, Take 1 tablet (4 mg total) by mouth every 8 (eight) hours as needed for nausea or vomiting. (Patient not taking: Reported on 12/29/2018), Disp: 20 tablet, Rfl: 0  EXAM:  VITALS per patient if applicable: There were no vitals taken for this visit.   GENERAL: alert, oriented, appears well and in no acute distress  HEENT: atraumatic, conjunttiva clear, no obvious abnormalities on inspection of external nose and ears  NECK: normal movements of the head and neck  LUNGS: on inspection no signs of respiratory distress, breathing rate appears normal, no obvious gross SOB, gasping or wheezing  CV: no obvious cyanosis  MS: moves all visible extremities without noticeable abnormality  PSYCH/NEURO: pleasant and cooperative, no obvious depression or anxiety, speech and thought processing grossly intact  LABS: none today Lab Results  Component Value Date   TSH 2.69 01/03/2015    Lab Results  Component Value Date   WBC 9.3 07/02/2019   HGB 13.7 07/02/2019   HCT 41.3 07/02/2019   MCV 87.6 07/02/2019   PLT 304.0 07/02/2019   Lab Results  Component Value Date   CREATININE 0.80 07/02/2019   BUN 11 07/02/2019   NA 140 07/02/2019   K 4.4 07/02/2019   CL 104 07/02/2019   CO2 26 07/02/2019   Lab Results  Component Value Date   ALT 18 01/20/2018   AST 19 01/20/2018   ALKPHOS 104 01/20/2018   BILITOT 0.2 11/23/2016   Lab Results  Component Value Date   CHOL 286 (A) 01/20/2018   Lab Results  Component Value Date   HDL 41 01/20/2018   No results found for: [facility] Lab Results  Component Value Date   TRIG 403 (A) 01/20/2018   Lab Results  Component Value Date   CHOLHDL 8 01/03/2015   Lab Results  Component Value Date   HGBA1C 5.7 01/04/2012    ASSESSMENT AND PLAN:  Discussed the following assessment and plan:  1) Acute recurrent maxillary sinusitis: vs viral syndrome. With her hx will treat with levaquin 500 mg qd x 10d.  She'll restart nasal rinses. I refilled her maxalt for prn use for migraine HA.  2) GAD/recurrent MDD: stable, in remission. Needs to stay on paxil and lorazepam long term. Needs to complete CSC and do UDS at next f/u in 6 wks (discussed with pt today and she is aware and agreeable to this).  3) DOE, wheezing.  I think she has mild COPD vs asthma sx's.  Unable to get PFTs -->will check on why. She has quit smoking in the past 6 mo or so. Will start trial of symbicort 160/4.5, 2 p bid. Has albuterol hfa to use q4h prn.   I discussed the assessment and treatment plan with the patient. The patient was provided an opportunity to ask questions and all were answered. The patient agreed with the plan and demonstrated an understanding of the instructions.   The patient was advised to call back or seek an in-person evaluation if the symptoms worsen or if the condition fails to improve as anticipated.  F/u: 6 wks  Signed:   Crissie Sickles,  MD           08/06/2019

## 2019-08-09 ENCOUNTER — Telehealth: Payer: Self-pay

## 2019-08-09 DIAGNOSIS — E782 Mixed hyperlipidemia: Secondary | ICD-10-CM

## 2019-08-09 NOTE — Telephone Encounter (Signed)
I do not see an order for a Lipid Panel. Please advise

## 2019-08-09 NOTE — Telephone Encounter (Signed)
Patient stats that she was to have labs drawn before she was to have had xray done. She forget to fast so they werent able to draw labs for cholesterol.  I didn't see any orders from Dr. Anitra Lauth. Patient requesting to make appt to check chol. Please advise.  Patient can be reached at [phone]

## 2019-08-09 NOTE — Telephone Encounter (Signed)
OK, labs ordered (fasting)

## 2019-08-09 NOTE — Telephone Encounter (Signed)
Cholesterol lab ordered, please schedule lab appt

## 2019-08-11 ENCOUNTER — Other Ambulatory Visit (HOSPITAL_COMMUNITY)
Admission: RE | Admit: 2019-08-11 | Discharge: 2019-08-11 | Disposition: A | Discharge status: Home / Self Care | Payer: BC Managed Care – PPO | Source: Ambulatory Visit | Attending: Family Medicine | Admitting: Family Medicine

## 2019-08-11 DIAGNOSIS — Z20822 Contact with and (suspected) exposure to covid-19: Secondary | ICD-10-CM | POA: Insufficient documentation

## 2019-08-11 DIAGNOSIS — Z01812 Encounter for preprocedural laboratory examination: Secondary | ICD-10-CM | POA: Diagnosis present

## 2019-08-11 LAB — SARS CORONAVIRUS 2 (TAT 6-24 HRS): SARS Coronavirus 2: NEGATIVE

## 2019-08-13 ENCOUNTER — Other Ambulatory Visit: Payer: BC Managed Care – PPO

## 2019-08-13 HISTORY — PX: OTHER SURGICAL HISTORY: SHX169

## 2019-08-13 NOTE — Telephone Encounter (Signed)
Scheduled fasting lab appt 08/17/19

## 2019-08-15 ENCOUNTER — Ambulatory Visit (HOSPITAL_COMMUNITY)
Admission: RE | Admit: 2019-08-15 | Discharge: 2019-08-15 | Disposition: A | Discharge status: Home / Self Care | Payer: BC Managed Care – PPO | Source: Ambulatory Visit | Attending: Family Medicine | Admitting: Family Medicine

## 2019-08-15 ENCOUNTER — Other Ambulatory Visit: Payer: Self-pay

## 2019-08-15 DIAGNOSIS — F17201 Nicotine dependence, unspecified, in remission: Secondary | ICD-10-CM

## 2019-08-15 DIAGNOSIS — R0609 Other forms of dyspnea: Secondary | ICD-10-CM

## 2019-08-15 DIAGNOSIS — R0600 Dyspnea, unspecified: Secondary | ICD-10-CM | POA: Insufficient documentation

## 2019-08-15 LAB — PULMONARY FUNCTION TEST
DL/VA % pred: 101 %
DL/VA: 4.31 ml/min/mmHg/L
DLCO unc % pred: 93 %
DLCO unc: 19.4 ml/min/mmHg
FEF 25-75 Post: 3.28 L/sec
FEF 25-75 Pre: 2.2 L/sec
FEF2575-%Change-Post: 49 %
FEF2575-%Pred-Post: 126 %
FEF2575-%Pred-Pre: 84 %
FEV1-%Change-Post: 9 %
FEV1-%Pred-Post: 99 %
FEV1-%Pred-Pre: 90 %
FEV1-Post: 2.69 L
FEV1-Pre: 2.45 L
FEV1FVC-%Change-Post: 4 %
FEV1FVC-%Pred-Pre: 98 %
FEV6-%Change-Post: 5 %
FEV6-%Pred-Post: 98 %
FEV6-%Pred-Pre: 93 %
FEV6-Post: 3.29 L
FEV6-Pre: 3.13 L
FEV6FVC-%Change-Post: 0 %
FEV6FVC-%Pred-Post: 103 %
FEV6FVC-%Pred-Pre: 102 %
FVC-%Change-Post: 5 %
FVC-%Pred-Post: 95 %
FVC-%Pred-Pre: 90 %
FVC-Post: 3.29 L
FVC-Pre: 3.13 L
Post FEV1/FVC ratio: 82 %
Post FEV6/FVC ratio: 100 %
Pre FEV1/FVC ratio: 78 %
Pre FEV6/FVC Ratio: 100 %
RV % pred: 97 %
RV: 1.82 L
TLC % pred: 98 %
TLC: 4.97 L

## 2019-08-15 MED ORDER — ALBUTEROL SULFATE (2.5 MG/3ML) 0.083% IN NEBU
2.5000 mg | INHALATION_SOLUTION | Freq: Once | RESPIRATORY_TRACT | Status: AC
  Administered 2019-08-15: 2.5 mg via RESPIRATORY_TRACT

## 2019-08-17 ENCOUNTER — Ambulatory Visit (INDEPENDENT_AMBULATORY_CARE_PROVIDER_SITE_OTHER): Payer: BC Managed Care – PPO | Admitting: Family Medicine

## 2019-08-17 ENCOUNTER — Other Ambulatory Visit: Payer: Self-pay

## 2019-08-17 DIAGNOSIS — E782 Mixed hyperlipidemia: Secondary | ICD-10-CM | POA: Diagnosis not present

## 2019-08-17 LAB — LIPID PANEL
Cholesterol: 299 mg/dL — ABNORMAL HIGH (ref 0–200)
HDL: 38.7 mg/dL — ABNORMAL LOW (ref >39.00)
NonHDL: 260.34
Total CHOL/HDL Ratio: 8
Triglycerides: 291 mg/dL — ABNORMAL HIGH (ref 0.0–149.0)
VLDL: 58.2 mg/dL — ABNORMAL HIGH (ref 0.0–40.0)

## 2019-08-17 LAB — HEPATIC FUNCTION PANEL
ALT: 11 U/L (ref 0–35)
AST: 15 U/L (ref 0–37)
Albumin: 4.2 g/dL (ref 3.5–5.2)
Alkaline Phosphatase: 83 U/L (ref 39–117)
Bilirubin, Direct: 0.1 mg/dL (ref 0.0–0.3)
Total Bilirubin: 0.4 mg/dL (ref 0.2–1.2)
Total Protein: 7 g/dL (ref 6.0–8.3)

## 2019-08-17 LAB — LDL CHOLESTEROL, DIRECT: Direct LDL: 201 mg/dL

## 2019-08-18 ENCOUNTER — Encounter: Payer: Self-pay | Admitting: Family Medicine

## 2019-08-20 ENCOUNTER — Encounter: Payer: Self-pay | Admitting: Family Medicine

## 2019-08-20 NOTE — Telephone Encounter (Signed)
Patient had PFT on 08/15/19 and would like to have results explained. Please advise, thanks.

## 2019-08-20 NOTE — Telephone Encounter (Signed)
Good results, just a very small amount of changes in her lungs indicating some damage from smoking. It is possible that this explains some of her tendency to get short of breath and wheeze with activity.  Has she been using the symbicort inhaler I rx'd her?

## 2019-08-21 ENCOUNTER — Encounter: Payer: Self-pay | Admitting: Family Medicine

## 2019-08-24 ENCOUNTER — Other Ambulatory Visit: Payer: Self-pay

## 2019-08-24 DIAGNOSIS — E782 Mixed hyperlipidemia: Secondary | ICD-10-CM

## 2019-08-24 MED ORDER — ATORVASTATIN CALCIUM 40 MG PO TABS: 40.0000 mg | ORAL_TABLET | Freq: Every day | ORAL | 0 refills | Status: AC

## 2019-08-24 NOTE — Progress Notes (Signed)
ato

## 2019-11-13 ENCOUNTER — Other Ambulatory Visit: Payer: Self-pay | Admitting: Orthopedic Surgery

## 2019-11-13 DIAGNOSIS — M545 Low back pain, unspecified: Secondary | ICD-10-CM

## 2019-11-22 ENCOUNTER — Other Ambulatory Visit: Payer: Self-pay | Admitting: Family Medicine

## 2019-11-22 NOTE — Telephone Encounter (Signed)
Patient is coming for labs on 6/15. Will wait for results

## 2019-11-27 ENCOUNTER — Ambulatory Visit: Payer: BC Managed Care – PPO

## 2019-11-27 ENCOUNTER — Other Ambulatory Visit: Payer: Self-pay

## 2019-11-27 DIAGNOSIS — E782 Mixed hyperlipidemia: Secondary | ICD-10-CM

## 2019-11-27 NOTE — Telephone Encounter (Signed)
Patient did not show for labs, refill will be denied. She will need to come in for cholesterol recheck.

## 2019-12-10 ENCOUNTER — Ambulatory Visit
Admission: RE | Admit: 2019-12-10 | Discharge: 2019-12-10 | Disposition: A | Discharge status: Home / Self Care | Payer: BC Managed Care – PPO | Source: Ambulatory Visit | Attending: Orthopedic Surgery | Admitting: Orthopedic Surgery

## 2019-12-10 DIAGNOSIS — M545 Low back pain, unspecified: Secondary | ICD-10-CM

## 2020-01-12 ENCOUNTER — Encounter: Payer: Self-pay | Admitting: Family Medicine

## 2020-02-21 ENCOUNTER — Ambulatory Visit: Payer: BC Managed Care – PPO

## 2020-02-21 ENCOUNTER — Telehealth: Payer: Self-pay

## 2020-02-21 NOTE — Telephone Encounter (Signed)
Form placed on PCP desk for review

## 2020-02-21 NOTE — Telephone Encounter (Signed)
I put a note on your desk. I'll do the lipid panel I ordered but I won't do the labs the other doctor ordered.

## 2020-02-21 NOTE — Telephone Encounter (Signed)
Patient dropped off form from [employer], Dr, Ericka Pontiff office, [facility], [facility] She is requesting to get the labs done here along with her lab appt when she is scheduled.  Her lab order is in [place] from Dr. Anitra Lauth. Gave Britt form.  Please give patient call [phone].

## 2020-02-22 ENCOUNTER — Encounter: Payer: Self-pay | Admitting: Family Medicine

## 2020-02-22 NOTE — Telephone Encounter (Signed)
Spoke with patient this morning to explain labs recommended by Dr.Brannon would have to be ordered by him and she could go to [facility] [facility] or [facility] facility to have drawn. She began to get upset stating she figured she could just kill 2 birds with one stone since Dr.Brannon's office does not have a lab onsite to do them and was told to come to PCP. I explained again we could not do labs without evaluation or based on another physician's evaluation and just having diagnosis codes already provided. She then stated she would find another provider if we would not be able to do labs and hung up the phone. She called the front office after getting off the phone with me stating the same thing.

## 2020-02-22 NOTE — Telephone Encounter (Signed)
She will be discharged from the practice for recurrent episodes of very rude behavior towards staff AND repeated "threats" to find another PCP. She hung up the phone on my nurse. Repetitive rude behavior will not be tolerated. I have signed the discharge from practice paperwork already. Signed:  Crissie Sickles, MD           02/22/2020

## 2020-07-04 ENCOUNTER — Other Ambulatory Visit: Payer: Self-pay | Admitting: Family Medicine

## 2023-06-29 ENCOUNTER — Other Ambulatory Visit: Payer: Self-pay | Admitting: Gastroenterology

## 2023-06-29 DIAGNOSIS — R1011 Right upper quadrant pain: Secondary | ICD-10-CM

## 2023-07-01 ENCOUNTER — Ambulatory Visit
Admission: RE | Admit: 2023-07-01 | Discharge: 2023-07-01 | Disposition: A | Discharge status: Home / Self Care | Payer: Self-pay | Source: Ambulatory Visit | Attending: Gastroenterology | Admitting: Gastroenterology

## 2023-07-01 DIAGNOSIS — R1011 Right upper quadrant pain: Secondary | ICD-10-CM

## 2023-07-05 ENCOUNTER — Other Ambulatory Visit: Payer: Self-pay | Admitting: Gastroenterology

## 2023-07-05 DIAGNOSIS — R935 Abnormal findings on diagnostic imaging of other abdominal regions, including retroperitoneum: Secondary | ICD-10-CM

## 2023-07-06 ENCOUNTER — Ambulatory Visit
Admission: RE | Admit: 2023-07-06 | Discharge: 2023-07-06 | Disposition: A | Discharge status: Home / Self Care | Payer: Self-pay | Source: Ambulatory Visit | Attending: Gastroenterology | Admitting: Gastroenterology

## 2023-07-06 DIAGNOSIS — R935 Abnormal findings on diagnostic imaging of other abdominal regions, including retroperitoneum: Secondary | ICD-10-CM

## 2023-07-06 MED ORDER — GADOPICLENOL 0.5 MMOL/ML IV SOLN
8.0000 mL | Freq: Once | INTRAVENOUS | Status: AC | PRN
  Administered 2023-07-06: 8 mL via INTRAVENOUS
